# Patient Record
Sex: Male | Born: 1965
Health system: Southern US, Community
[De-identification: ages and names within clinical notes are randomized; demographics above are authoritative.]

## PROBLEM LIST (undated history)

## (undated) ENCOUNTER — Emergency Department: Disposition: A | Discharge status: Home / Self Care | Payer: 59

## (undated) DIAGNOSIS — I499 Cardiac arrhythmia, unspecified: Secondary | ICD-10-CM

---

## 2005-01-27 ENCOUNTER — Ambulatory Visit: Payer: Self-pay | Admitting: Cardiology

## 2014-05-12 ENCOUNTER — Encounter: Payer: Self-pay | Admitting: Orthopedic Surgery

## 2014-05-12 ENCOUNTER — Ambulatory Visit (INDEPENDENT_AMBULATORY_CARE_PROVIDER_SITE_OTHER): Payer: 59

## 2014-05-12 ENCOUNTER — Ambulatory Visit (INDEPENDENT_AMBULATORY_CARE_PROVIDER_SITE_OTHER): Payer: 59 | Admitting: Orthopedic Surgery

## 2014-05-12 VITALS — BP 126/76 | Ht 73.0 in | Wt 186.6 lb

## 2014-05-12 DIAGNOSIS — M719 Bursopathy, unspecified: Principal | ICD-10-CM

## 2014-05-12 DIAGNOSIS — M67919 Unspecified disorder of synovium and tendon, unspecified shoulder: Secondary | ICD-10-CM | POA: Insufficient documentation

## 2014-05-12 DIAGNOSIS — M25511 Pain in right shoulder: Secondary | ICD-10-CM

## 2014-05-12 DIAGNOSIS — M25519 Pain in unspecified shoulder: Secondary | ICD-10-CM

## 2014-05-12 NOTE — Progress Notes (Signed)
New patient visit  Chief Complaint  Patient presents with  . 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 His pain in the elbows lateral and associated with lifting and use  His shoulder pain is posterior and anterolateral along the deltoid associated with use. Associated with abduction but not necessarily for elevation mild night pain mild weakness with use no trauma  Medical history   Previous vasectomy   Current medications multivitamins and Aleve. Brief history Apolonio Schneiders fibrillation which resolved without medication he has no allergies his family history is noted for hypertension and cancer. 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   Review of systems has been recorded reviewed and signed and scanned into the chart  Vital signs BP 126/76  Ht 6\' 1"  (1.854 m)  Wt 186 lb 9.6 oz (84.641 kg)  BMI 24.62 kg/m2   General appearance: Development, nutrition are normal. Body habitus normal No gross deformities are noted and grooming normal.  Peripheral vascular system no swelling or varicose veins are noted and pulses are palpable without tenderness, temperature warm to touch no edema.  No palpable lymph nodes are noted in the cervical area or axillae.  The skin overlying the right and left shoulder cervical and thoracic spine is normal without rash, lesion or ulceration  Deep tendon reflexes are normal and equal. And pathologic reflexes such as Hoffman sign are negative.  Sensation remains normal.  The patient is oriented to person place and time, the mood and affect are normal  Ambulation remains normal  Cervical spine no mass or tenderness. Range of motion is normal. Muscle tone is normal. Skin is normal.  Right shoulder  No specific tenderness is palpated. A.c. joint is nontender the anterolateral acromion is  nontender the posterior joint line glenohumeral joint tenderness.  His range of motion is full. He has a negative abduction external rotation is normal stability of the shoulder. He has no weakness in any of the rotator cuff musculature.  His impingement sign is negative. Manual muscle testing the rotator cuff was normal grade 5. There is mild pain with Hawkins maneuver  Left shoulder  inspection reveals no tenderness or malalignment. There is no crepitation. The range of motion remains full flexion internal and external rotation. Stability tests are normal in abduction external rotation inferior subluxation test as well as the posterior stress test. Manual muscle testing of the supraspinatus, internal and external rotators 5 over 5  Cervical spine no tenderness  X-rays are normal of the shoulder  Rotator cuff syndrome right shoulder  Our recommendations are for Subacromial injection  Addendum right elbow lateral epicondyle and pain with wrist extension otherwise neurovascularly intact right upper extremity elbow ligaments are stable  Tennis elbow brace  No improvement in his shoulder after 4 weeks he is to call for an appointment

## 2014-05-12 NOTE — Patient Instructions (Signed)
Joint Injection  Care After  Refer to this sheet in the next few days. These instructions provide you with information on caring for yourself after you have had a joint injection. Your caregiver also may give you more specific instructions. Your treatment has been planned according to current medical practices, but problems sometimes occur. Call your caregiver if you have any problems or questions after your procedure.  After any type of joint injection, it is not uncommon to experience:  · Soreness, swelling, or bruising around the injection site.  · Mild numbness, tingling, or weakness around the injection site caused by the numbing medicine used before or with the injection.  It also is possible to experience the following effects associated with the specific agent after injection:  · Iodine-based contrast agents:  ¨ Allergic reaction (itching, hives, widespread redness, and swelling beyond the injection site).  · Corticosteroids (These effects are rare.):  ¨ Allergic reaction.  ¨ Increased blood sugar levels (If you have diabetes and you notice that your blood sugar levels have increased, notify your caregiver).  ¨ Increased blood pressure levels.  ¨ Mood swings.  · Hyaluronic acid in the use of viscosupplementation.  ¨ Temporary heat or redness.  ¨ Temporary rash and itching.  ¨ Increased fluid accumulation in the injected joint.  These effects all should resolve within a day after your procedure.   HOME CARE INSTRUCTIONS  · Limit yourself to light activity the day of your procedure. Avoid lifting heavy objects, bending, stooping, or twisting.  · Take prescription or over-the-counter pain medication as directed by your caregiver.  · You may apply ice to your injection site to reduce pain and swelling the day of your procedure. Ice may be applied 03-04 times:  ¨ Put ice in a plastic bag.  ¨ Place a towel between your skin and the bag.  ¨ Leave the ice on for no longer than 15-20 minutes each time.  SEEK  IMMEDIATE MEDICAL CARE IF:   · Pain and swelling get worse rather than better or extend beyond the injection site.  · Numbness does not go away.  · Blood or fluid continues to leak from the injection site.  · You have chest pain.  · You have swelling of your face or tongue.  · You have trouble breathing or you become dizzy.  · You develop a fever, chills, or severe tenderness at the injection site that last longer than 1 day.  MAKE SURE YOU:  · Understand these instructions.  · Watch your condition.  · Get help right away if you are not doing well or if you get worse.  Document Released: 06/02/2011 Document Revised: 12/12/2011 Document Reviewed: 06/02/2011  Exit[employer]® Patient Information ©2015 [employer]. This information is not intended to replace advice given to you by your health care provider. Make sure you discuss any questions you have with your health care provider.

## 2014-07-21 ENCOUNTER — Telehealth: Payer: Self-pay | Admitting: Orthopedic Surgery

## 2014-07-21 ENCOUNTER — Other Ambulatory Visit: Payer: Self-pay | Admitting: *Deleted

## 2014-07-21 DIAGNOSIS — M75101 Unspecified rotator cuff tear or rupture of right shoulder, not specified as traumatic: Secondary | ICD-10-CM

## 2014-07-21 NOTE — Telephone Encounter (Signed)
Pt ordered and patient aware to call and arrange

## 2014-07-21 NOTE — Telephone Encounter (Signed)
He will need to go for PT for 3 x a week x 6 weeks

## 2014-07-21 NOTE — Telephone Encounter (Signed)
Routing to Dr Harrison 

## 2014-07-23 ENCOUNTER — Ambulatory Visit: Payer: 59 | Attending: Orthopedic Surgery | Admitting: Physical Therapy

## 2014-07-23 DIAGNOSIS — M7550 Bursitis of unspecified shoulder: Secondary | ICD-10-CM | POA: Insufficient documentation

## 2014-07-23 DIAGNOSIS — M25511 Pain in right shoulder: Secondary | ICD-10-CM | POA: Diagnosis present

## 2014-07-23 DIAGNOSIS — M25521 Pain in right elbow: Secondary | ICD-10-CM | POA: Diagnosis not present

## 2014-07-23 DIAGNOSIS — M25611 Stiffness of right shoulder, not elsewhere classified: Secondary | ICD-10-CM | POA: Insufficient documentation

## 2014-07-30 ENCOUNTER — Ambulatory Visit: Payer: 59 | Admitting: Physical Therapy

## 2014-07-30 DIAGNOSIS — M25511 Pain in right shoulder: Secondary | ICD-10-CM | POA: Diagnosis not present

## 2014-08-04 ENCOUNTER — Ambulatory Visit: Payer: 59 | Attending: Orthopedic Surgery | Admitting: Physical Therapy

## 2014-08-04 DIAGNOSIS — M25521 Pain in right elbow: Secondary | ICD-10-CM | POA: Diagnosis not present

## 2014-08-04 DIAGNOSIS — M25611 Stiffness of right shoulder, not elsewhere classified: Secondary | ICD-10-CM | POA: Diagnosis not present

## 2014-08-04 DIAGNOSIS — M7550 Bursitis of unspecified shoulder: Secondary | ICD-10-CM | POA: Insufficient documentation

## 2014-08-04 DIAGNOSIS — M25511 Pain in right shoulder: Secondary | ICD-10-CM | POA: Diagnosis present

## 2014-08-06 ENCOUNTER — Ambulatory Visit: Payer: 59 | Admitting: Physical Therapy

## 2014-08-06 DIAGNOSIS — M25511 Pain in right shoulder: Secondary | ICD-10-CM | POA: Diagnosis not present

## 2014-08-11 ENCOUNTER — Ambulatory Visit: Payer: 59 | Admitting: Physical Therapy

## 2014-08-11 DIAGNOSIS — M25511 Pain in right shoulder: Secondary | ICD-10-CM | POA: Diagnosis not present

## 2014-08-13 ENCOUNTER — Ambulatory Visit: Payer: 59 | Admitting: Physical Therapy

## 2014-08-13 DIAGNOSIS — M25511 Pain in right shoulder: Secondary | ICD-10-CM | POA: Diagnosis not present

## 2014-08-18 ENCOUNTER — Ambulatory Visit: Payer: 59 | Admitting: Physical Therapy

## 2014-08-20 ENCOUNTER — Ambulatory Visit: Payer: 59 | Admitting: Physical Therapy

## 2014-08-21 ENCOUNTER — Ambulatory Visit: Payer: 59 | Admitting: Physical Therapy

## 2014-08-21 DIAGNOSIS — M25511 Pain in right shoulder: Secondary | ICD-10-CM | POA: Diagnosis not present

## 2014-08-25 ENCOUNTER — Ambulatory Visit: Payer: 59 | Admitting: Physical Therapy

## 2014-08-25 DIAGNOSIS — M25511 Pain in right shoulder: Secondary | ICD-10-CM | POA: Diagnosis not present

## 2014-08-27 ENCOUNTER — Encounter: Payer: 59 | Admitting: Physical Therapy

## 2014-09-01 ENCOUNTER — Encounter: Payer: 59 | Admitting: Physical Therapy

## 2017-02-24 DIAGNOSIS — H6121 Impacted cerumen, right ear: Secondary | ICD-10-CM | POA: Diagnosis not present

## 2017-02-24 DIAGNOSIS — Z13220 Encounter for screening for lipoid disorders: Secondary | ICD-10-CM | POA: Diagnosis not present

## 2017-02-24 DIAGNOSIS — Z125 Encounter for screening for malignant neoplasm of prostate: Secondary | ICD-10-CM | POA: Diagnosis not present

## 2017-02-24 DIAGNOSIS — Z0000 Encounter for general adult medical examination without abnormal findings: Secondary | ICD-10-CM | POA: Diagnosis not present

## 2017-02-24 DIAGNOSIS — Z131 Encounter for screening for diabetes mellitus: Secondary | ICD-10-CM | POA: Diagnosis not present

## 2017-07-02 ENCOUNTER — Emergency Department (INDEPENDENT_AMBULATORY_CARE_PROVIDER_SITE_OTHER)
Admission: EM | Admit: 2017-07-02 | Discharge: 2017-07-02 | Disposition: A | Discharge status: Home / Self Care | Payer: 59 | Source: Home / Self Care | Attending: Emergency Medicine | Admitting: Emergency Medicine

## 2017-07-02 ENCOUNTER — Telehealth: Payer: Self-pay | Admitting: Emergency Medicine

## 2017-07-02 ENCOUNTER — Encounter: Payer: Self-pay | Admitting: Emergency Medicine

## 2017-07-02 DIAGNOSIS — K0889 Other specified disorders of teeth and supporting structures: Secondary | ICD-10-CM | POA: Diagnosis not present

## 2017-07-02 MED ORDER — HYDROCODONE-ACETAMINOPHEN 5-325 MG PO TABS: 1.0000 | ORAL_TABLET | ORAL | 0 refills | Status: DC | PRN

## 2017-07-02 NOTE — ED Triage Notes (Signed)
Tooth & Jaw Pain x 4 days

## 2017-07-02 NOTE — Telephone Encounter (Signed)
Notified patient that his elevated BP at today's visit should be followed up with his PCP.

## 2017-07-02 NOTE — Discharge Instructions (Signed)
Please call your dentist first thing in the morning to be checked. Take ibuprofen 600 mg every 6 hours with food. You can apply some anesthetic dental paste to the tooth and see if that gives you some relief.

## 2017-07-02 NOTE — ED Provider Notes (Addendum)
[facility]    CSN: [id number] Arrival date & time: 07/02/17  1544     History   Chief Complaint Chief Complaint  Patient presents with  . Dental Pain    HPI ISAURO SKELLEY is a 51 y.o. male.   HPI Pertinent history on this patient is that he developed pain in his right lower posterior molar last week. He went to the dentist on Thursday and was seen by Dr. Bing Plume who works with Dr. Ronnald Ramp. X-rays were taken and these were normal. He was sent to the specialist for evaluation because the pain was felt to becoming from a nerve. The specialist did not find any signs of a nerve root problem and so did not perform any treatment. Over the past 3 days he has had excruciating pain which has been in the right lower posterior molar extending to the lower portion of the right jaw. He was initially taking 2 Aleve every 3 or 4 hours. He was taking this with Tylenol. He had a prescription for amoxicillin but was advised by the dental surgeon not to take it because there were no signs of infection. He understood today because he cannot get relief. He did take 2 Tylenol 3 without any improvement. History reviewed. No pertinent past medical history.  Patient Active Problem List   Diagnosis Date Noted  . Disorders of bursae and tendons in shoulder region, unspecified 05/12/2014    History reviewed. No pertinent surgical history.     Home Medications    Prior to Admission medications   Medication Sig Start Date End Date Taking? Authorizing Provider  HYDROcodone-acetaminophen (NORCO) 5-325 MG tablet Take 1 tablet by mouth every 4 (four) hours as needed for moderate pain or severe pain. 07/02/17   Darlyne Russian, MD    Family History History reviewed. No pertinent family history.  Social History Social History  Substance Use Topics  . Smoking status: Never Smoker  . Smokeless tobacco: Never Used  . Alcohol use No     Allergies   Patient has no known allergies.   Review of  Systems Review of Systems  Constitutional: Negative.   HENT: Positive for dental problem. Negative for ear discharge, ear pain, facial swelling and sinus pain.   Respiratory: Negative.      Physical Exam Triage Vital Signs ED Triage Vitals  Enc Vitals Group     BP 07/02/17 1602 (!) 153/98     Pulse Rate 07/02/17 1602 65     Resp 07/02/17 1602 16     Temp 07/02/17 1602 98.5 F (36.9 C)     Temp Source 07/02/17 1602 Oral     SpO2 07/02/17 1602 97 %     Weight 07/02/17 1602 195 lb (88.5 kg)     Height 07/02/17 1602 6\' 1"  (1.854 m)     Head Circumference --      Peak Flow --      Pain Score 07/02/17 1603 8     Pain Loc --      Pain Edu? --      Excl. in [place]? --    No data found.   Updated Vital Signs BP (!) 153/98 (BP Location: Left Arm)   Pulse 65   Temp 98.5 F (36.9 C) (Oral)   Resp 16   Ht 6\' 1"  (1.854 m)   Wt 195 lb (88.5 kg)   SpO2 97%   BMI 25.73 kg/m   Visual Acuity Right Eye Distance:   Left  Eye Distance:   Bilateral Distance:    Right Eye Near:   Left Eye Near:    Bilateral Near:     Physical Exam  Constitutional: He appears well-developed and well-nourished.  HENT:  Head: Normocephalic.  Nose: Nose normal.  Mouth/Throat: Oropharynx is clear and moist.  Neck: Normal range of motion. Neck supple.  There is exquisite tenderness to palpation over the right lower second molar. There is no swelling around the gumline. There is no lymph node beneath the tooth.He does appear uncomfortable complaining of pain in his tooth.   UC Treatments / Results  Labs (all labs ordered are listed, but only abnormal results are displayed) Labs Reviewed - No data to display  EKG  EKG Interpretation None       Radiology No results found.  Procedures Procedures (including critical care time)  Medications Ordered in UC Medications - No data to display   Initial Impression / Assessment and Plan / UC Course  I have reviewed the triage vital signs and the  nursing notes. I suspect the patient's blood pressure is elevated because he is having so much discomfort in his tooth. The tooth itself was tender to touch with a tongue blade. There are no signs of infection. He is going to see the dentist again tomorrow. I have given him 10 hydrocodone to take for pain. He will be on ibuprofen 600 mg every 6 hours over-the-counter.he was seen by his oral surgeon on Friday and advised he did not need to be on antibiotics. There were no signs of infection at that time and we'll continue him off of antibiotics until seen again tomorrow.blood pressure was elevated at 153/98 I suspect secondary to the pain he is in. I did advise him to follow-up after he has had his dental pain addressed. Pertinent labs & imaging results that were available during my care of the patient were reviewed by me and considered in my medical decision making (see chart for details).       Final Clinical Impressions(s) / UC Diagnoses   Final diagnoses:  Pain, dental    New Prescriptions New Prescriptions   HYDROCODONE-ACETAMINOPHEN (NORCO) 5-325 MG TABLET    Take 1 tablet by mouth every 4 (four) hours as needed for moderate pain or severe pain.      Controlled Substance Prescriptions [place] Controlled Substance Registry consulted? Yes, I have consulted the [place] Controlled Substances Registry for this patient, and feel the risk/benefit ratio today is favorable for proceeding with this prescription for a controlled substance.   Darlyne Russian, MD 07/02/17 1704    Darlyne Russian, MD 07/02/17 [phone]

## 2017-09-12 ENCOUNTER — Emergency Department (HOSPITAL_COMMUNITY): Payer: 59

## 2017-09-12 ENCOUNTER — Emergency Department (HOSPITAL_COMMUNITY)
Admission: EM | Admit: 2017-09-12 | Discharge: 2017-09-12 | Disposition: A | Discharge status: Home / Self Care | Payer: 59 | Attending: Emergency Medicine | Admitting: Emergency Medicine

## 2017-09-12 ENCOUNTER — Encounter (HOSPITAL_COMMUNITY): Payer: Self-pay

## 2017-09-12 DIAGNOSIS — W1843XA Slipping, tripping and stumbling without falling due to stepping from one level to another, initial encounter: Secondary | ICD-10-CM | POA: Diagnosis not present

## 2017-09-12 DIAGNOSIS — M79652 Pain in left thigh: Secondary | ICD-10-CM

## 2017-09-12 DIAGNOSIS — Y9389 Activity, other specified: Secondary | ICD-10-CM | POA: Diagnosis not present

## 2017-09-12 DIAGNOSIS — Y999 Unspecified external cause status: Secondary | ICD-10-CM | POA: Diagnosis not present

## 2017-09-12 DIAGNOSIS — S79912A Unspecified injury of left hip, initial encounter: Secondary | ICD-10-CM | POA: Diagnosis not present

## 2017-09-12 DIAGNOSIS — Y929 Unspecified place or not applicable: Secondary | ICD-10-CM | POA: Insufficient documentation

## 2017-09-12 DIAGNOSIS — W19XXXA Unspecified fall, initial encounter: Secondary | ICD-10-CM

## 2017-09-12 DIAGNOSIS — M25552 Pain in left hip: Secondary | ICD-10-CM | POA: Diagnosis not present

## 2017-09-12 DIAGNOSIS — S79922A Unspecified injury of left thigh, initial encounter: Secondary | ICD-10-CM | POA: Diagnosis not present

## 2017-09-12 MED ORDER — ONDANSETRON 4 MG PO TBDP
4.0000 mg | ORAL_TABLET | Freq: Once | ORAL | Status: AC
  Administered 2017-09-12: 4 mg via ORAL

## 2017-09-12 MED ORDER — TRAMADOL HCL 50 MG PO TABS: 50.0000 mg | ORAL_TABLET | Freq: Two times a day (BID) | ORAL | 0 refills | Status: DC | PRN

## 2017-09-12 MED ORDER — OXYCODONE-ACETAMINOPHEN 5-325 MG PO TABS
1.0000 | ORAL_TABLET | Freq: Once | ORAL | Status: AC
  Administered 2017-09-12: 1 via ORAL
  Filled 2017-09-12: qty 1

## 2017-09-12 NOTE — ED Triage Notes (Signed)
Patient stepped out of truck and slipped with left femur pain and heard pop, no loc. Pain with ambulation. VSS

## 2017-09-12 NOTE — ED Notes (Signed)
Pt returned from imaging.

## 2017-09-12 NOTE — Discharge Instructions (Signed)
1. Medications: Alternate 600 mg of ibuprofen and [phone] mg of Tylenol every 3 hours as needed for pain. Do not exceed 4000 mg of Tylenol daily.  Take tramadol for severe pain but do not drive, drink alcohol, or operate heavy machinery on this medication as it may make you drowsy. 2. Treatment: rest, ice, elevate and use Ace wrap, drink plenty of fluids, gentle stretching 3. Follow Up: Please followup with orthopedics as directed or your PCP in 1 week if no improvement for discussion of your diagnoses and further evaluation after today's visit; Please return to the ER for worsening symptoms or other concerns such as fevers, severe swelling, or weakness.

## 2017-09-12 NOTE — Progress Notes (Signed)
Orthopedic Tech Progress Note Patient Details:  Jerry Pacheco [birth year] [id number]  Ortho Devices Type of Ortho Device: Crutches Ortho Device/Splint Interventions: Application   Post Interventions Patient Tolerated: Well, Ambulated well Instructions Provided: Care of device, Adjustment of device   [name] 09/12/2017, 12:21 PM

## 2017-09-12 NOTE — ED Notes (Signed)
Patient transported to X-ray 

## 2017-09-12 NOTE — ED Provider Notes (Signed)
[facility]PART[facility] Provider Note   CSN: [id number] Arrival date & time: 09/12/17  1026     History   Chief Complaint No chief complaint on file.   HPI Jerry Pacheco is a 51 y.o. male with no significant past medical history presents today with chief complaint acute onset, constant left thigh pain.  He states that just prior to arrival he was stepping outside of a vehicle when he stepped on some ice with his right leg giving out.  He states in the process he shifted all of his weight to his left lower extremity and heard a "pop".  He endorses severe throbbing pain to the left thigh which does not radiate.  Pain worsens with movement, palpation, and ambulation and he states he is unable to bear weight on the lower extremity.  Denies numbness, tingling, or weakness.  He denies head injury or loss of consciousness.  No back pain, bowel or bladder incontinence, saddle anesthesia, IV drug use, or fevers.  Has not tried anything for his symptoms.  The history is provided by the patient.    History reviewed. No pertinent past medical history.  Patient Active Problem List   Diagnosis Date Noted  . Disorders of bursae and tendons in shoulder region, unspecified 05/12/2014    History reviewed. No pertinent surgical history.     Home Medications    Prior to Admission medications   Medication Sig Start Date End Date Taking? Authorizing Provider  HYDROcodone-acetaminophen (NORCO) 5-325 MG tablet Take 1 tablet by mouth every 4 (four) hours as needed for moderate pain or severe pain. Patient not taking: Reported on 09/12/2017 07/02/17   Jerry Russian, MD  traMADol (ULTRAM) 50 MG tablet Take 1 tablet (50 mg total) by mouth every 12 (twelve) hours as needed for severe pain. 09/12/17   Renita Papa, PA-C    Family History No family history on file.  Social History Social History   Tobacco Use  . Smoking status: Never Smoker  . Smokeless tobacco: Never  Used  Substance Use Topics  . Alcohol use: No  . Drug use: No     Allergies   Patient has no known allergies.   Review of Systems Review of Systems  Constitutional: Negative for chills and fever.  Respiratory: Negative for shortness of breath.   Cardiovascular: Negative for chest pain.  Gastrointestinal: Negative for abdominal pain, nausea and vomiting.  Musculoskeletal: Positive for arthralgias and gait problem.  Neurological: Negative for syncope, weakness, numbness and headaches.     Physical Exam Updated Vital Signs BP 127/75   Pulse 91   Temp 98.1 F (36.7 C) (Oral)   Resp 18   SpO2 100%   Physical Exam  Constitutional: He is oriented to person, place, and time. He appears well-developed and well-nourished. No distress.  Appears uncomfortable  HENT:  Head: Normocephalic and atraumatic.  Right Ear: External ear normal.  Left Ear: External ear normal.  No Battle's signs, no raccoon's eyes, no rhinorrhea.No tenderness to palpation of the face or skull. No deformity, crepitus, or swelling noted.   Eyes: Conjunctivae are normal. Right eye exhibits no discharge. Left eye exhibits no discharge.  Neck: Normal range of motion. Neck supple. No JVD present. No tracheal deviation present.  Cardiovascular: Normal rate, regular rhythm, normal heart sounds and intact distal pulses.  2+ radial and DP/PT pulses bl, negative Homan's bl   Pulmonary/Chest: Effort normal and breath sounds normal.  Abdominal: Soft. Bowel sounds are  normal. He exhibits no distension. There is no tenderness.  Musculoskeletal: He exhibits tenderness. He exhibits no edema.  Left thigh tender to palpation anteriorly with mild ecchymosis noted.  Severely decreased range of motion of the knee and hip secondary to pain, but quadriceps tendon appears intact.  No tenderness to palpation of the knee or hip.  No deformity or crepitus noted.  No leg length discrepancy.  No malalignment of the left lower externally.   No ecchymosis or swelling noted. 5/5 strength of BUE and BLE major muscle groups. No midline spine TTP, no paraspinal muscle tenderness, no deformity, crepitus, or step-off noted   Neurological: He is alert and oriented to person, place, and time. No sensory deficit. He exhibits normal muscle tone.  Fluent speech, no facial droop, sensation intact to soft touch of the face and extremities.  Unable to assess gait secondary to pain in the left thigh, but patient has good grip strength and good strength with plantarflexion, dorsiflexion, and extension of the EHLs.  Skin: Skin is warm and dry. No erythema.  Psychiatric: He has a normal mood and affect. His behavior is normal.  Nursing note and vitals reviewed.    ED Treatments / Results  Labs (all labs ordered are listed, but only abnormal results are displayed) Labs Reviewed - No data to display  EKG  EKG Interpretation None       Radiology Dg Hip Unilat With Pelvis 2-3 Views Left  Result Date: 09/12/2017 CLINICAL DATA:  Pain after slipping without frank fall EXAM: DG HIP (WITH OR WITHOUT PELVIS) 2-3V LEFT COMPARISON:  None. FINDINGS: Frontal pelvis as well as frontal and lateral left hip images were obtained. No appreciable fracture or dislocation. Joint spaces appear normal. No erosive change. IMPRESSION: No fracture or dislocation.  No evident arthropathy. Electronically Signed   By: Lowella Grip III M.D.   On: 09/12/2017 11:42   Dg Femur Min 2 Views Left  Result Date: 09/12/2017 CLINICAL DATA:  Patient with acute pain after slipping. No frank fall EXAM: LEFT FEMUR 2 VIEWS COMPARISON:  None. FINDINGS: Frontal and lateral views were obtained. No evident fracture or dislocation. No abnormal periosteal reaction. No appreciable arthropathic change. IMPRESSION: No fracture or dislocation.  No evident arthropathy. Electronically Signed   By: Lowella Grip III M.D.   On: 09/12/2017 11:41    Procedures Procedures (including  critical care time)  Medications Ordered in ED Medications  oxyCODONE-acetaminophen (PERCOCET/ROXICET) 5-325 MG per tablet 1 tablet (1 tablet Oral Given 09/12/17 1114)  ondansetron (ZOFRAN-ODT) disintegrating tablet 4 mg (4 mg Oral Given 09/12/17 1115)     Initial Impression / Assessment and Plan / ED Course  I have reviewed the triage vital signs and the nursing notes.  Pertinent labs & imaging results that were available during my care of the patient were reviewed by me and considered in my medical decision making (see chart for details).     Patient with left thigh pain secondary to injury earlier today.  Afebrile, vital signs are stable.  He is neurovascularly intact.  No complaint of back pain and no red flag signs concerning for cauda equina.  He is able to pull himself up to a standing position without difficulty  But has some pain with weightbearing in the left side.  On prompting the patient, PT is able to walk on his heels and toes without difficulty and take a few steps although it is painful.  Radiographs reviewed by me show no evidence of fracture  or dislocation.  Likely myofascial pain or soft tissue injury. Doubt DVT. Stable for discharge home with crutches and Ace wrap.  He will follow-up with orthopedics for reevaluation of his symptoms.  Pain managed while in the ED. [place] Controlled Substance Registry shows he received small amount of tylenol #3 in September but no controlled substances since. Will discharge with small amount of tramadol for severe pain. Discussed indications for return to the ED. Pt verbalized understanding of and agreement with plan and is safe for discharge home at this time. No complaints prior to discharge.    Final Clinical Impressions(s) / ED Diagnoses   Final diagnoses:  Pain of left thigh  Fall, initial encounter    ED Discharge Orders        Ordered    traMADol (ULTRAM) 50 MG tablet  Every 12 hours PRN     09/12/17 1159         Renita Papa, PA-C 09/12/17 1214    Julianne Rice, MD 09/13/17 1004

## 2017-09-18 ENCOUNTER — Ambulatory Visit (INDEPENDENT_AMBULATORY_CARE_PROVIDER_SITE_OTHER): Payer: 59 | Admitting: Orthopedic Surgery

## 2017-09-18 ENCOUNTER — Encounter: Payer: Self-pay | Admitting: Orthopedic Surgery

## 2017-09-18 VITALS — BP 128/78 | HR 70 | Ht 73.0 in | Wt 215.0 lb

## 2017-09-18 DIAGNOSIS — S76112A Strain of left quadriceps muscle, fascia and tendon, initial encounter: Secondary | ICD-10-CM | POA: Diagnosis not present

## 2017-09-18 NOTE — Progress Notes (Signed)
Progress Note   Patient ID: Jerry Pacheco, male   DOB: 1966-03-02, 51 y.o.   MRN: [id number]  Chief Complaint  Patient presents with  . Leg Pain    left thigh pain 09/12/17    HPI 51 year old male slipped and fell had x-rays and ER pelvis and femur no fracture  Comes in with thigh pain  Location pain left thigh quality dull severity mild duration 6 days timing  Context: Slight improvement  Modifying factors stairclimbing  Associated symptoms swelling  Review of Systems  Constitutional: Negative for chills, fever and weight loss.  Respiratory: Negative for shortness of breath.   Cardiovascular: Negative for chest pain.  Neurological: Negative for tingling.   No outpatient medications have been marked as taking for the 09/18/17 encounter (Office Visit) with Carole Civil, MD.    History reviewed. No pertinent past medical history. Denies hypertension diabetes  No Known Allergies  BP 128/78   Pulse 70   Ht 6\' 1"  (1.854 m)   Wt 215 lb (97.5 kg)   BMI 28.37 kg/m    Physical Exam  Constitutional: He is oriented to person, place, and time. He appears well-developed and well-nourished.  Vital signs have been reviewed and are stable. Gen. appearance the patient is well-developed and well-nourished with normal grooming and hygiene.   Musculoskeletal:  GAIT IS favorable limp of the left side  Neurological: He is alert and oriented to person, place, and time.  Skin: Skin is warm and dry. No erythema.  Psychiatric: He has a normal mood and affect.  Vitals reviewed.   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  . Rupture of left quadriceps muscle, initial encounter Yes    Recommend economy hinged brace, reduced activity at work  Follow-up 6 weeks    Arther Abbott, MD 09/18/2017 8:54 AM

## 2017-10-30 ENCOUNTER — Ambulatory Visit (INDEPENDENT_AMBULATORY_CARE_PROVIDER_SITE_OTHER): Payer: 59 | Admitting: Orthopedic Surgery

## 2017-10-30 ENCOUNTER — Encounter: Payer: Self-pay | Admitting: Orthopedic Surgery

## 2017-10-30 VITALS — BP 131/81 | HR 65 | Ht 73.0 in | Wt 216.0 lb

## 2017-10-30 DIAGNOSIS — S76112D Strain of left quadriceps muscle, fascia and tendon, subsequent encounter: Secondary | ICD-10-CM | POA: Diagnosis not present

## 2017-10-30 NOTE — Patient Instructions (Signed)
Exercises daily for 6 weeks

## 2017-10-30 NOTE — Progress Notes (Signed)
Progress Note   Patient ID: Jerry Pacheco, male   DOB: January 25, 1966, 52 y.o.   MRN: [id number]  Chief Complaint  Patient presents with  . Knee Pain    Left quad, feels better date of injury 09/12/17    52 year old male had a partial quadriceps injury left leg approximately 6 weeks ago he says he feels much better we placed him in a brace he did well is able to go up steps he just cannot single leg squat.     ROS No outpatient medications have been marked as taking for the 10/30/17 encounter (Office Visit) with Carole Civil, MD.    No Known Allergies   BP 131/81   Pulse 65   Ht 6\' 1"  (1.854 m)   Wt 216 lb (98 kg)   BMI 28.50 kg/m   Physical Exam  Musculoskeletal:       Legs:    Medical decision-making Encounter Diagnosis  Name Primary?  . Rupture of left quadriceps muscle, subsequent encounter Yes      Home exercises for quadriceps strengthening for 6 weeks  Released   Arther Abbott, MD 10/30/2017 9:15 AM

## 2018-02-28 DIAGNOSIS — Z125 Encounter for screening for malignant neoplasm of prostate: Secondary | ICD-10-CM | POA: Diagnosis not present

## 2018-02-28 DIAGNOSIS — Z1211 Encounter for screening for malignant neoplasm of colon: Secondary | ICD-10-CM | POA: Diagnosis not present

## 2018-02-28 DIAGNOSIS — Z13220 Encounter for screening for lipoid disorders: Secondary | ICD-10-CM | POA: Diagnosis not present

## 2018-02-28 DIAGNOSIS — Z131 Encounter for screening for diabetes mellitus: Secondary | ICD-10-CM | POA: Diagnosis not present

## 2018-02-28 DIAGNOSIS — Z0000 Encounter for general adult medical examination without abnormal findings: Secondary | ICD-10-CM | POA: Diagnosis not present

## 2018-03-21 DIAGNOSIS — H5213 Myopia, bilateral: Secondary | ICD-10-CM | POA: Diagnosis not present

## 2018-03-21 DIAGNOSIS — H52223 Regular astigmatism, bilateral: Secondary | ICD-10-CM | POA: Diagnosis not present

## 2018-07-02 ENCOUNTER — Ambulatory Visit (INDEPENDENT_AMBULATORY_CARE_PROVIDER_SITE_OTHER): Payer: Self-pay

## 2018-07-02 DIAGNOSIS — Z1211 Encounter for screening for malignant neoplasm of colon: Secondary | ICD-10-CM

## 2018-07-02 MED ORDER — CLENPIQ 10-3.5-12 MG-GM -GM/160ML PO SOLN: 1.0000 | Freq: Once | ORAL | 0 refills | Status: AC

## 2018-07-02 NOTE — Progress Notes (Signed)
Gastroenterology Pre-Procedure Review  Request Date:07/02/18 Requesting Physician: Dr.Morrow- Jerry Pacheco at [facility]- no previous tcs  PATIENT REVIEW QUESTIONS: The patient responded to the following health history questions as indicated:    1. Diabetes Melitis: no 2. Joint replacements in the past 12 months: no 3. Major health problems in the past 3 months: no 4. Has an artificial valve or MVP: no 5. Has a defibrillator: no 6. Has been advised in past to take antibiotics in advance of a procedure like teeth cleaning: no 7. Family history of colon cancer: no  8. Alcohol Use: yes (occasionally) 9. History of sleep apnea: no  10. History of coronary artery or other vascular stents placed within the last 12 months: no 11. History of any prior anesthesia complications: no    MEDICATIONS & ALLERGIES:    Patient reports the following regarding taking any blood thinners:   Plavix? no Aspirin? no Coumadin? no Brilinta? no Xarelto? no Eliquis? no Pradaxa? no Savaysa? no Effient? no  Patient confirms/reports the following medications:  No current outpatient medications on file.   No current facility-administered medications for this visit.     Patient confirms/reports the following allergies:  No Known Allergies  No orders of the defined types were placed in this encounter.   AUTHORIZATION INFORMATION Primary Insurance: [employer] [place],  [place] #: [id number] Pre-Cert / Josem Kaufmann required:  Pre-Cert / Josem Kaufmann #:    SCHEDULE INFORMATION: Procedure has been scheduled as follows:  Date:08/22/18 , Time: 1:00 Location: [facility] Dr.Rourk  This Gastroenterology Pre-Precedure Review Form is being routed to the following provider(s): Walden Field NP

## 2018-07-02 NOTE — Patient Instructions (Addendum)
CLENPIQ SPLIT PREP INSTRUCTIONS   Patient Name:  Jerry Pacheco Date of procedure:  10/10/18 Time to register at [place] Stay:  10:15pm Provider:  Dr. Gala Romney  Due to a change in the hospital schedule, we had to change the time of your colonoscopy. Please note the following time changes: You will need to arrive at 10:15 instead of 11:00am and on the day of your colonoscopy, you will need to drink your prep at 6:15am instead of 7:00am and please stop drinking liquids at 8:15am. Sorry for any confusion. Please call if you have any questions.    Please notify us immediately if you are diabetic, take iron supplements, or if you are on coumadin or any blood thinners.    Note: Do NOT refrigerate or freeze CLENPIQ. CLENPIQ is ready to drink. There is no need to add any other liquid or mix the medicine in the bottle before you start dosing.   10/09/18  1 Day prior to procedure:     CLEAR LIQUIDS ALL DAY--NO SOLID FOODS OR DAIRY PRODUCTS! See list of liquids that are allowed and items that are NOT allowed below.     You must drink plenty of CLEAR LIQUIDS starting before your bowel prep. It is important to stay adequately hydrated before, during, and after your bowel prep for the prep to work effectively!    At 5:00 PM Begin the prep as follows:    1. Drink one bottle of premixed CLENPIQ right from the bottle. 2. Drink at least five (5) 8-ounce drinks of clear liquids of your choice within the next 5 hours   Continue clear liquids.    10/10/18 Day of Procedure    You may take TYLENOL products. Please continue your regular medications unless we have instructed you otherwise.    5 hours before procedure @ 6:15am:  1. Drink second bottle of premixed CLENPIQ right from the bottle.   2. Drink at least three (3) 8-ounce drinks of clear liquids of your choice within the next 2 hours. You can drink more if needed.   3 hours before your procedure time @ 8:15am: Stop drinking all liquids,  nothing by mouth at this point.  Please note, on the day of your procedure you MUST be accompanied by an adult who is willing to assume responsibility for you at time of discharge. If you do not have such person with you, your procedure will have to be rescheduled.                                                                                                                     Please leave ALL jewelry at home prior to coming to the hospital for your procedure.   *It is your responsibility to check with your insurance company for the benefits of coverage you have for this procedure. Unfortunately, not all insurance companies have benefits to cover all or part of these types of procedures. It is your responsibility to check your benefits, however  we will be glad to assist you with any codes your insurance company may need.   Please note that most insurance companies will not cover a screening colonoscopy for people under the age of 51  For example, with some insurance companies you may have benefits for a screening colonoscopy, but if polyps are found the diagnosis will change and then you may have a deductible that will need to be met. Please make sure you check your benefits for screening colonoscopy as well as a diagnostic colonoscopy.   CLEAR LIQUIDS: (NO RED or PURPLE) Water  Jello   Apple Juice  White Grape Juice   Kool-Aid Soft drinks  Banana popsicles Sports Drink  Black coffee (No cream or milk) Tea (No cream or milk)  Broth (fat free beef/chicken/vegetable)  Clear liquids allow you to see your fingers on the other side of the glass.  Be sure they are NOT RED or PURPLE in color, cloudy, but CLEAR.  Do Not Eat: Dairy products of any kind Cranberry juice Tomato or V8 Juice  Orange Juice   Grapefruit Juice Red Grape Juice Alcohol   Non-dairy creamer Solid foods like cereal, oatmeal, yogurt, fruits, vegetables, creamed soups, eggs, bread, etc   HELPFUL HINTS TO MAKE DRINKING  EASIER: -Trying drinking through a straw. -If you become nauseated, try consuming smaller amounts or stretch out the time between glasses.  Stop for 30 minutes & slowly start back drinking.  Call our office with any questions or concerns at [phone] Thank You

## 2018-07-03 NOTE — Progress Notes (Signed)
Ok to schedule.

## 2018-08-06 ENCOUNTER — Telehealth: Payer: Self-pay | Admitting: Internal Medicine

## 2018-08-06 NOTE — Progress Notes (Signed)
Pts wife called and rescheduled pts tcs to 10/10/18. See phone note.

## 2018-08-06 NOTE — Telephone Encounter (Signed)
Pt's wife called to reschedule patient's colonoscopy with [facility] on 11/20. [phone]

## 2018-08-06 NOTE — Telephone Encounter (Signed)
Called and spoke with the pts wife. Changed appt for tcs to 10/10/18. New instructions mailed to the pt. Carolyn in endo is aware.

## 2018-08-07 NOTE — Progress Notes (Signed)
Jerry Pacheco called and we had to move up the pts appointment 45 minutes. I have changed his instructions and mailed them to the pt.

## 2018-09-27 MED FILL — CLENPIQ 10-3.5-12 MG-GM -GM: 10-3.5-12 M | 2 days supply | Qty: 320 | Fill #0

## 2018-10-10 ENCOUNTER — Encounter (HOSPITAL_COMMUNITY): Payer: Self-pay | Admitting: Anesthesiology

## 2018-10-10 ENCOUNTER — Encounter (HOSPITAL_COMMUNITY): Payer: Self-pay | Admitting: *Deleted

## 2018-10-10 ENCOUNTER — Other Ambulatory Visit: Payer: Self-pay

## 2018-10-10 ENCOUNTER — Ambulatory Visit (HOSPITAL_COMMUNITY)
Admission: RE | Admit: 2018-10-10 | Discharge: 2018-10-10 | Disposition: A | Discharge status: Home / Self Care | Payer: 59 | Attending: Internal Medicine | Admitting: Internal Medicine

## 2018-10-10 ENCOUNTER — Encounter (HOSPITAL_COMMUNITY)
Admission: RE | Disposition: A | Discharge status: Home / Self Care | Payer: Self-pay | Source: Home / Self Care | Attending: Internal Medicine

## 2018-10-10 DIAGNOSIS — K640 First degree hemorrhoids: Secondary | ICD-10-CM | POA: Insufficient documentation

## 2018-10-10 DIAGNOSIS — Z1211 Encounter for screening for malignant neoplasm of colon: Secondary | ICD-10-CM | POA: Diagnosis not present

## 2018-10-10 DIAGNOSIS — D125 Benign neoplasm of sigmoid colon: Secondary | ICD-10-CM | POA: Diagnosis not present

## 2018-10-10 DIAGNOSIS — D123 Benign neoplasm of transverse colon: Secondary | ICD-10-CM | POA: Diagnosis not present

## 2018-10-10 HISTORY — PX: POLYPECTOMY: SHX5525

## 2018-10-10 HISTORY — PX: COLONOSCOPY: SHX5424

## 2018-10-10 HISTORY — DX: Cardiac arrhythmia, unspecified: I49.9

## 2018-10-10 SURGERY — COLONOSCOPY
Anesthesia: Moderate Sedation

## 2018-10-10 MED ORDER — ONDANSETRON HCL 4 MG/2ML IJ SOLN
INTRAMUSCULAR | Status: DC | PRN
  Administered 2018-10-10: 4 mg via INTRAVENOUS

## 2018-10-10 MED ORDER — MEPERIDINE HCL 100 MG/ML IJ SOLN
INTRAMUSCULAR | Status: DC | PRN
  Administered 2018-10-10: 25 mg via INTRAVENOUS

## 2018-10-10 MED ORDER — MIDAZOLAM HCL 5 MG/5ML IJ SOLN
INTRAMUSCULAR | Status: DC | PRN
  Administered 2018-10-10: 1 mg via INTRAVENOUS
  Administered 2018-10-10: 2 mg via INTRAVENOUS
  Administered 2018-10-10 (×4): 1 mg via INTRAVENOUS
  Administered 2018-10-10: 2 mg via INTRAVENOUS
  Administered 2018-10-10: 1 mg via INTRAVENOUS

## 2018-10-10 MED ORDER — ONDANSETRON HCL 4 MG/2ML IJ SOLN
INTRAMUSCULAR | Status: AC
  Filled 2018-10-10: qty 2

## 2018-10-10 MED ORDER — SODIUM CHLORIDE 0.9 % IV SOLN
INTRAVENOUS | Status: DC
  Administered 2018-10-10: 1000 mL via INTRAVENOUS

## 2018-10-10 MED ORDER — MEPERIDINE HCL 50 MG/ML IJ SOLN
INTRAMUSCULAR | Status: AC
  Filled 2018-10-10: qty 1

## 2018-10-10 MED ORDER — MIDAZOLAM HCL 5 MG/5ML IJ SOLN
INTRAMUSCULAR | Status: AC
  Filled 2018-10-10: qty 10

## 2018-10-10 NOTE — Op Note (Signed)
[facility] Patient Name: Jerry Pacheco Procedure Date: 10/10/2018 11:20 AM MRN: [id number] Date of Birth: [birth year] Attending MD: Norvel Richards , MD CSN: [id number] Age: 53 Admit Type: Outpatient Procedure:                Colonoscopy Indications:              Screening for colorectal malignant neoplasm Providers:                Norvel Richards, MD, Lurline Del, RN, Nelma Rothman, Technician Referring MD:              Medicines:                Midazolam 10 mg IV, Meperidine 25 mg IV Complications:            No immediate complications. Estimated Blood Loss:     Estimated blood loss: none. Procedure:                Pre-Anesthesia Assessment:                           - Prior to the procedure, a History and Physical                            was performed, and patient medications and                            allergies were reviewed. The patient's tolerance of                            previous anesthesia was also reviewed. The risks                            and benefits of the procedure and the sedation                            options and risks were discussed with the patient.                            All questions were answered, and informed consent                            was obtained. Prior Anticoagulants: The patient has                            taken no previous anticoagulant or antiplatelet                            agents. ASA Grade Assessment: II - A patient with                            mild systemic disease. After reviewing the risks  and benefits, the patient was deemed in                            satisfactory condition to undergo the procedure.                           After obtaining informed consent, the colonoscope                            was passed under direct vision. Throughout the                            procedure, the patient's blood pressure, pulse, and      oxygen saturations were monitored continuously. The                            CF-[id number] ([id number]) scope was introduced through                            the anus and advanced to the the ileocecal valve.                            The colonoscopy was performed without difficulty.                            The patient tolerated the procedure well. The                            quality of the bowel preparation was adequate. Scope In: 11:28:19 AM Scope Out: 11:54:14 AM Scope Withdrawal Time: 0 hours 9 minutes 58 seconds  Total Procedure Duration: 0 hours 25 minutes 55 seconds  Findings:      The perianal and digital rectal examinations were normal.      Two sessile polyps were found in the sigmoid colon and hepatic flexure.       The polyps were 4 to 6 mm in size. These polyps were removed with a cold       snare. Resection and retrieval were complete. Estimated blood loss was       minimal.      Non-bleeding internal hemorrhoids were found during retroflexion. The       hemorrhoids were moderate, medium-sized and Grade I (internal       hemorrhoids that do not prolapse).      The exam was otherwise without abnormality on direct and retroflexion       views. Impression:               - Two 4 to 6 mm polyps in the sigmoid colon and at                            the hepatic flexure, removed with a cold snare.                            Resected and retrieved.                           -  Non-bleeding internal hemorrhoids.                           - The examination was otherwise normal on direct                            and retroflexion views. Moderate Sedation:      Moderate (conscious) sedation was administered by the endoscopy nurse       and supervised by the endoscopist. The following parameters were       monitored: oxygen saturation, heart rate, blood pressure, respiratory       rate, EKG, adequacy of pulmonary ventilation, and response to care.       Total physician  intraservice time was 31 minutes. Recommendation:           - Patient has a contact number available for                            emergencies. The signs and symptoms of potential                            delayed complications were discussed with the                            patient. Return to normal activities tomorrow.                            Written discharge instructions were provided to the                            patient.                           - Advance diet as tolerated.                           - Continue present medications.                           - Repeat colonoscopy date to be determined after                            pending pathology results are reviewed for                            surveillance. Procedure Code(s):        --- Professional ---                           [phone], Colonoscopy, flexible; with removal of                            tumor(s), polyp(s), or other lesion(s) by snare                            technique  60109, Moderate sedation; each additional 15                            minutes intraservice time                           G0500, Moderate sedation services provided by the                            same physician or other qualified health care                            professional performing a gastrointestinal                            endoscopic service that sedation supports,                            requiring the presence of an independent trained                            observer to assist in the monitoring of the                            patient's level of consciousness and physiological                            status; initial 15 minutes of intra-service time;                            patient age 17 years or older (additional time may                            be reported with [phone], as appropriate) Diagnosis Code(s):        --- Professional ---                           Z12.11,  Encounter for screening for malignant                            neoplasm of colon                           D12.5, Benign neoplasm of sigmoid colon                           D12.3, Benign neoplasm of transverse colon (hepatic                            flexure or splenic flexure)                           K64.0, First degree hemorrhoids CPT copyright 2018 American Medical Association. All rights reserved. The codes documented in this report are preliminary and upon coder review may  be revised to meet current  compliance requirements. Jerry Pacheco. Jerry Benham, MD Norvel Richards, MD 10/10/2018 12:00:01 PM This report has been signed electronically. Number of Addenda: 0

## 2018-10-10 NOTE — H&P (Signed)
@LOGO @   Primary Care Physician:  Dineen Kid, MD Primary Gastroenterologist:  Dr. Gala Romney  Pre-Procedure History & Physical: HPI:  ULICE Pacheco is a 53 y.o. male is here for a screening colonoscopy.  No prior colonoscopy.  No family history of colon cancer.  No GI symptoms.  Past Medical History:  Diagnosis Date  . Dysrhythmia    history of A-Fib    History reviewed. No pertinent surgical history.  Prior to Admission medications   Medication Sig Start Date End Date Taking? Authorizing Provider  acetaminophen (TYLENOL) 650 MG CR tablet Take 1,300 mg by mouth every 8 (eight) hours as needed for pain.   Yes [provider]  ibuprofen (ADVIL,MOTRIN) 200 MG tablet Take 400-600 mg by mouth every 8 (eight) hours as needed (PAIN/HEADACHE).   Yes [provider]    Allergies as of 07/03/2018  . (No Known Allergies)    History reviewed. No pertinent family history.  Social History   Socioeconomic History  . Marital status: Married    Spouse name: Not on file  . Number of children: Not on file  . Years of education: Not on file  . Highest education level: Not on file  Occupational History  . Not on file  Social Needs  . Financial resource strain: Not on file  . Food insecurity:    Worry: Not on file    Inability: Not on file  . Transportation needs:    Medical: Not on file    Non-medical: Not on file  Tobacco Use  . Smoking status: Never Smoker  . Smokeless tobacco: Never Used  Substance and Sexual Activity  . Alcohol use: No  . Drug use: No  . Sexual activity: Not on file  Lifestyle  . Physical activity:    Days per week: Not on file    Minutes per session: Not on file  . Stress: Not on file  Relationships  . Social connections:    Talks on phone: Not on file    Gets together: Not on file    Attends religious service: Not on file    Active member of club or organization: Not on file    Attends meetings of clubs or organizations: Not on file   Relationship status: Not on file  . Intimate partner violence:    Fear of current or ex partner: Not on file    Emotionally abused: Not on file    Physically abused: Not on file    Forced sexual activity: Not on file  Other Topics Concern  . Not on file  Social History Narrative  . Not on file    Review of Systems: See HPI, otherwise negative ROS  Physical Exam: BP 129/86   Pulse 60   Temp 98.3 F (36.8 C) (Oral)   Resp 16   Ht 6\' 1"  (1.854 m)   Wt 93 kg   SpO2 97%   BMI 27.05 kg/m  General:   Alert,  Well-developed, well-nourished, pleasant and cooperative in NAD l. Neck:  Supple; no masses or thyromegaly. Lungs:  Clear throughout to auscultation.   No wheezes, crackles, or rhonchi. No acute distress. Heart:  Regular rate and rhythm; no murmurs, clicks, rubs,  or gallops. Abdomen:  Soft, nontender and nondistended. No masses, hepatosplenomegaly or hernias noted. Normal bowel sounds, without guarding, and without rebound.    Impression/Plan: Jerry Pacheco is now here to undergo a screening colonoscopy.  First ever average risk screening examination.  Risks, benefits,  limitations, imponderables and alternatives regarding colonoscopy have been reviewed with the patient. Questions have been answered. All parties agreeable.     Notice:  This dictation was prepared with Dragon dictation along with smaller phrase technology. Any transcriptional errors that result from this process are unintentional and may not be corrected upon review.

## 2018-10-10 NOTE — Discharge Instructions (Signed)
°Colonoscopy °Discharge Instructions ° °Read the instructions outlined below and refer to this sheet in the next few weeks. These discharge instructions provide you with general information on caring for yourself after you leave the hospital. Your doctor may also give you specific instructions. While your treatment has been planned according to the most current medical practices available, unavoidable complications occasionally occur. If you have any problems or questions after discharge, call Dr. Rourk at [phone]. °ACTIVITY °· You may resume your regular activity, but move at a slower pace for the next 24 hours.  °· Take frequent rest periods for the next 24 hours.  °· Walking will help get rid of the air and reduce the bloated feeling in your belly (abdomen).  °· No driving for 24 hours (because of the medicine (anesthesia) used during the test).   °· Do not sign any important legal documents or operate any machinery for 24 hours (because of the anesthesia used during the test).  °NUTRITION °· Drink plenty of fluids.  °· You may resume your normal diet as instructed by your doctor.  °· Begin with a light meal and progress to your normal diet. Heavy or fried foods are harder to digest and may make you feel sick to your stomach (nauseated).  °· Avoid alcoholic beverages for 24 hours or as instructed.  °MEDICATIONS °· You may resume your normal medications unless your doctor tells you otherwise.  °WHAT YOU CAN EXPECT TODAY °· Some feelings of bloating in the abdomen.  °· Passage of more gas than usual.  °· Spotting of blood in your stool or on the toilet paper.  °IF YOU HAD POLYPS REMOVED DURING THE COLONOSCOPY: °· No aspirin products for 7 days or as instructed.  °· No alcohol for 7 days or as instructed.  °· Eat a soft diet for the next 24 hours.  °FINDING OUT THE RESULTS OF YOUR TEST °Not all test results are available during your visit. If your test results are not back during the visit, make an appointment  with your caregiver to find out the results. Do not assume everything is normal if you have not heard from your caregiver or the medical facility. It is important for you to follow up on all of your test results.  °SEEK IMMEDIATE MEDICAL ATTENTION IF: °· You have more than a spotting of blood in your stool.  °· Your belly is swollen (abdominal distention).  °· You are nauseated or vomiting.  °· You have a temperature over 101.  °· You have abdominal pain or discomfort that is severe or gets worse throughout the day.  ° ° °Colon polyp information provided ° °Further recommendations to follow pending review of pathology report ° ° °Colon Polyps ° °Polyps are tissue growths inside the body. Polyps can grow in many places, including the large intestine (colon). A polyp may be a round bump or a mushroom-shaped growth. You could have one polyp or several. °Most colon polyps are noncancerous (benign). However, some colon polyps can become cancerous over time. Finding and removing the polyps early can help prevent this. °What are the causes? °The exact cause of colon polyps is not known. °What increases the risk? °You are more likely to develop this condition if you: °· Have a family history of colon cancer or colon polyps. °· Are older than 50 or older than 45 if you are African American. °· Have inflammatory bowel disease, such as ulcerative colitis or Crohn's disease. °· Have certain hereditary conditions, such   as: °? Familial adenomatous polyposis. °? Lynch syndrome. °? Turcot syndrome. °? Peutz-Jeghers syndrome. °· Are overweight. °· Smoke cigarettes. °· Do not get enough exercise. °· Drink too much alcohol. °· Eat a diet that is high in fat and red meat and low in fiber. °· Had childhood cancer that was treated with abdominal radiation. °What are the signs or symptoms? °Most polyps do not cause symptoms. °If you have symptoms, they may include: °· Blood coming from your rectum when having a bowel movement. °· Blood in  your stool. The stool may look dark red or black. °· Abdominal pain. °· A change in bowel habits, such as constipation or diarrhea. °How is this diagnosed? °This condition is diagnosed with a colonoscopy. This is a procedure in which a lighted, flexible scope is inserted into the anus and then passed into the colon to examine the area. Polyps are sometimes found when a colonoscopy is done as part of routine cancer screening tests. °How is this treated? °Treatment for this condition involves removing any polyps that are found. Most polyps can be removed during a colonoscopy. Those polyps will then be tested for cancer. Additional treatment may be needed depending on the results of testing. °Follow these instructions at home: °Lifestyle °· Maintain a healthy weight, or lose weight if recommended by your health care provider. °· Exercise every day or as told by your health care provider. °· Do not use any products that contain nicotine or tobacco, such as cigarettes and e-cigarettes. If you need help quitting, ask your health care provider. °· If you drink alcohol, limit how much you have: °? 0-1 drink a day for women. °? 0-2 drinks a day for men. °· Be aware of how much alcohol is in your drink. In the [place] one drink equals one 12 oz bottle of beer (355 mL), one 5 oz glass of wine (148 mL), or one 1½ oz shot of hard liquor (44 mL). °Eating and drinking ° °· Eat foods that are high in fiber, such as fruits, vegetables, and whole grains. °· Eat foods that are high in calcium and vitamin D, such as milk, cheese, yogurt, eggs, liver, fish, and broccoli. °· Limit foods that are high in fat, such as fried foods and desserts. °· Limit the amount of red meat and processed meat you eat, such as hot dogs, sausage, bacon, and lunch meats. °General instructions °· Keep all follow-up visits as told by your health care provider. This is important. °? This includes having regularly scheduled colonoscopies. °? Talk to your health  care provider about when you need a colonoscopy. °Contact a health care provider if: °· You have new or worsening bleeding during a bowel movement. °· You have new or increased blood in your stool. °· You have a change in bowel habits. °· You lose weight for no known reason. °Summary °· Polyps are tissue growths inside the body. Polyps can grow in many places, including the colon. °· Most colon polyps are noncancerous (benign), but some can become cancerous over time. °· This condition is diagnosed with a colonoscopy. °· Treatment for this condition involves removing any polyps that are found. Most polyps can be removed during a colonoscopy. °This information is not intended to replace advice given to you by your health care provider. Make sure you discuss any questions you have with your health care provider. °Document Released: 06/15/2004 Document Revised: 01/04/2018 Document Reviewed: 01/04/2018 °Elsevier Interactive Patient Education © 2019 [employer] Inc[employer] ° °

## 2018-10-11 ENCOUNTER — Encounter: Payer: Self-pay | Admitting: Internal Medicine

## 2018-10-15 ENCOUNTER — Encounter (HOSPITAL_COMMUNITY): Payer: Self-pay | Admitting: Internal Medicine

## 2018-11-02 ENCOUNTER — Encounter: Payer: Self-pay | Admitting: Orthopedic Surgery

## 2018-11-02 ENCOUNTER — Ambulatory Visit: Payer: 59 | Admitting: Orthopedic Surgery

## 2018-11-02 ENCOUNTER — Ambulatory Visit (INDEPENDENT_AMBULATORY_CARE_PROVIDER_SITE_OTHER): Payer: 59

## 2018-11-02 VITALS — BP 143/90 | HR 59 | Ht 73.0 in | Wt 216.0 lb

## 2018-11-02 DIAGNOSIS — M25511 Pain in right shoulder: Secondary | ICD-10-CM

## 2018-11-02 DIAGNOSIS — G8929 Other chronic pain: Secondary | ICD-10-CM

## 2018-11-02 DIAGNOSIS — M722 Plantar fascial fibromatosis: Secondary | ICD-10-CM

## 2018-11-02 DIAGNOSIS — M7581 Other shoulder lesions, right shoulder: Secondary | ICD-10-CM | POA: Diagnosis not present

## 2018-11-02 DIAGNOSIS — M778 Other enthesopathies, not elsewhere classified: Secondary | ICD-10-CM

## 2018-11-02 NOTE — Progress Notes (Signed)
NEW Problem//OFFICE VISIT  Chief Complaint  Patient presents with  . Shoulder Pain    right   . 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  He complains of pain over the right shoulder joint which is dull moderate activity related with increased pain after work in the evenings.  No catching sensations no locking   Review of Systems  Constitutional: Negative for fever.  Respiratory: Negative for shortness of breath.   Cardiovascular: Negative for chest pain.  Skin: Negative.   Neurological: Negative for tingling and sensory change.     Past Medical History:  Diagnosis Date  . Dysrhythmia    history of A-Fib    Past Surgical History:  Procedure Laterality Date  . COLONOSCOPY N/A 10/10/2018   Procedure: COLONOSCOPY;  Surgeon: Daneil Dolin, MD;  Location: [facility];  Service: Endoscopy;  Laterality: N/A;  1:00  . POLYPECTOMY  10/10/2018   Procedure: POLYPECTOMY;  Surgeon: Daneil Dolin, MD;  Location: [facility];  Service: Endoscopy;;  hepatic flexure, sigmoid    Family History  Problem Relation Age of Onset  . Healthy Mother   . Healthy Father    Social History   Tobacco Use  . Smoking status: Never Smoker  . Smokeless tobacco: Never Used  Substance Use Topics  . Alcohol use: No  . Drug use: No    No Known Allergies  Current Meds  Medication Sig  . acetaminophen (TYLENOL) 650 MG CR tablet Take 1,300 mg by mouth every 8 (eight) hours as needed for pain.    BP (!) 143/90   Pulse (!) 59   Ht 6\' 1"  (1.854 m)   Wt 216 lb (98 kg)   BMI 28.50 kg/m   Physical Exam Constitutional:      Appearance: He is well-developed.  Neurological:     Mental Status: He is alert and oriented to person, place, and time.  Psychiatric:        Behavior: Behavior normal.        Thought  Content: Thought content normal.        Judgment: Judgment normal.     Right Shoulder Exam  Right shoulder exam is normal.  Tenderness  The patient is experiencing no tenderness.  Range of Motion  The patient has normal right shoulder ROM.  Muscle Strength  The patient has normal right shoulder strength (Pain with resisted abduction was noted).  Tests  Apprehension: negative Hawkins test: negative Cross arm: negative Impingement: negative Drop arm: negative Sulcus: absent  Other  Erythema: absent Sensation: normal Pulse: present   Left Shoulder Exam  Left shoulder exam is normal.  Tenderness  The patient is experiencing no tenderness.   Range of Motion  The patient has normal left shoulder ROM.  Muscle Strength  The patient has normal left shoulder strength.  Tests  Apprehension: negative  Other  Erythema: absent Sensation: normal Pulse: present      Addendum right foot  The patient has tenderness in the plantar aspect of his right foot normal arch normal dorsiflexion Neuro vascular exam intact  MEDICAL DECISION SECTION  Xrays were done at [facility] orth[facility]  My independent reading of xrays:  Normal x-ray see separate report  Encounter Diagnoses  Name Primary?  . Chronic pain in  right shoulder   . Plantar fasciitis   . Right shoulder tendonitis Yes    PLAN: (Rx., injectx, surgery, frx, mri/ct) Procedure note Inject plantar fascia RIGHT HEEL   Timeout was completed to confirm the site of injection  The medications used were 40 mg of Depo-Medrol and 1% lidocaine 3 cc  Anesthesia was provided by ethyl chloride and the skin was prepped with alcohol.  After cleaning the skin with alcohol a 25-gauge needle was used to inject the plantar fascia, no complications were noted sterile bandage was applied   Procedure note the subacromial injection shoulder RIGHT  Verbal consent was obtained to inject the  RIGHT   Shoulder  Timeout was  completed to confirm the injection site is a subacromial space of the  RIGHT  shoulder   Medication used Depo-Medrol 40 mg and lidocaine 1% 3 cc  Anesthesia was provided by ethyl chloride  The injection was performed in the RIGHT  posterior subacromial space. After pinning the skin with alcohol and anesthetized the skin with ethyl chloride the subacromial space was injected using a 20-gauge needle. There were no complications  Sterile dressing was applied.    No orders of the defined types were placed in this encounter.   Arther Abbott, MD  11/02/2018 10:27 AM

## 2018-11-02 NOTE — Patient Instructions (Addendum)
Plantar Fasciitis  Plantar fasciitis is a painful foot condition that affects the heel. It occurs when the band of tissue that connects the toes to the heel bone (plantar fascia) becomes irritated. This can happen as the result of exercising too much or doing other repetitive activities (overuse injury). The pain from plantar fasciitis can range from mild irritation to severe pain that makes it difficult to walk or move. The pain is usually worse in the morning after sleeping, or after sitting or lying down for a while. Pain may also be worse after long periods of walking or standing. What are the causes? This condition may be caused by:  Standing for long periods of time.  Wearing shoes that do not have good arch support.  Doing activities that put stress on joints (high-impact activities), including running, aerobics, and ballet.  Being overweight.  An abnormal way of walking (gait).  Tight muscles in the back of your lower leg (calf).  High arches in your feet.  Starting a new athletic activity. What are the signs or symptoms? The main symptom of this condition is heel pain. Pain may:  Be worse with first steps after a time of rest, especially in the morning after sleeping or after you have been sitting or lying down for a while.  Be worse after long periods of standing still.  Decrease after 30-45 minutes of activity, such as gentle walking. How is this diagnosed? This condition may be diagnosed based on your medical history and your symptoms. Your health care provider may ask questions about your activity level. Your health care provider will do a physical exam to check for:  A tender area on the bottom of your foot.  A high arch in your foot.  Pain when you move your foot.  Difficulty moving your foot. You may have imaging tests to confirm the diagnosis, such as:  X-rays.  Ultrasound.  MRI. How is this treated? Treatment for plantar fasciitis depends on how  severe your condition is. Treatment may include:  Rest, ice, applying pressure (compression), and raising the affected foot (elevation). This may be called RICE therapy. Your health care provider may recommend RICE therapy along with over-the-counter pain medicines to manage your pain.  Exercises to stretch your calves and your plantar fascia.  A splint that holds your foot in a stretched, upward position while you sleep (night splint).  Physical therapy to relieve symptoms and prevent problems in the future.  Injections of steroid medicine (cortisone) to relieve pain and inflammation.  Stimulating your plantar fascia with electrical impulses (extracorporeal shock wave therapy). This is usually the last treatment option before surgery.  Surgery, if other treatments have not worked after 12 months. Follow these instructions at home:  Managing pain, stiffness, and swelling  If directed, put ice on the painful area: ? Put ice in a plastic bag, or use a frozen bottle of water. ? Place a towel between your skin and the bag or bottle. ? Roll the bottom of your foot over the bag or bottle. ? Do this for 20 minutes, 2-3 times a day.  Wear athletic shoes that have air-sole or gel-sole cushions, or try wearing soft shoe inserts that are designed for plantar fasciitis.  Raise (elevate) your foot above the level of your heart while you are sitting or lying down. Activity  Avoid activities that cause pain. Ask your health care provider what activities are safe for you.  Do physical therapy exercises and stretches as told   by your health care provider.  Try activities and forms of exercise that are easier on your joints (low-impact). Examples include swimming, water aerobics, and biking. General instructions  Take over-the-counter and prescription medicines only as told by your health care provider.  Wear a night splint while sleeping, if told by your health care provider. Loosen the splint  if your toes tingle, become numb, or turn cold and blue.  Maintain a healthy weight, or work with your health care provider to lose weight as needed.  Keep all follow-up visits as told by your health care provider. This is important. Contact a health care provider if you:  Have symptoms that do not go away after caring for yourself at home.  Have pain that gets worse.  Have pain that affects your ability to move or do your daily activities. Summary  Plantar fasciitis is a painful foot condition that affects the heel. It occurs when the band of tissue that connects the toes to the heel bone (plantar fascia) becomes irritated.  The main symptom of this condition is heel pain that may be worse after exercising too much or standing still for a long time.  Treatment varies, but it usually starts with rest, ice, compression, and elevation (RICE therapy) and over-the-counter medicines to manage pain. This information is not intended to replace advice given to you by your health care provider. Make sure you discuss any questions you have with your health care provider. Document Released: 06/14/2001 Document Revised: 07/17/2017 Document Reviewed: 07/17/2017 Elsevier Interactive Patient Education  2019 [employer].   These are the muscle and arthrits creams I recommend:  PLEASE READ THE PACKAGE INSTRUCTIONS BEFORE USING   Ben Gay arthritis cream  Icy hot vanishing gel  Aspercreme odor free  Myoflex Oderless pain reliever  Capzasin  Sportscreme  Max freeze

## 2018-12-19 ENCOUNTER — Other Ambulatory Visit: Payer: Self-pay

## 2018-12-19 ENCOUNTER — Encounter: Payer: Self-pay | Admitting: Orthopedic Surgery

## 2018-12-19 ENCOUNTER — Ambulatory Visit: Payer: 59 | Admitting: Orthopedic Surgery

## 2018-12-19 VITALS — BP 140/88 | HR 66 | Ht 73.0 in | Wt 210.0 lb

## 2018-12-19 DIAGNOSIS — G8929 Other chronic pain: Secondary | ICD-10-CM | POA: Diagnosis not present

## 2018-12-19 DIAGNOSIS — M25511 Pain in right shoulder: Secondary | ICD-10-CM

## 2018-12-19 DIAGNOSIS — M75111 Incomplete rotator cuff tear or rupture of right shoulder, not specified as traumatic: Secondary | ICD-10-CM

## 2018-12-19 NOTE — Progress Notes (Signed)
Progress Note   Patient ID: Jerry Pacheco, male   DOB: 11/14/1965, 53 y.o.   MRN: [id number]   Chief Complaint  Patient presents with  . 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  He complains of pain over the right shoulder joint which is dull moderate activity related with increased pain after work in the evenings.  No catching sensations no locking      Review of Systems  Musculoskeletal: Negative for neck pain.  Neurological: Negative for tingling.     No Known Allergies   BP 140/88   Pulse 66   Ht 6\' 1"  (1.854 m)   Wt 210 lb (95.3 kg)   BMI 27.71 kg/m   Physical Exam Vitals signs and nursing note reviewed.  Constitutional:      Appearance: Normal appearance.  Musculoskeletal:     Right shoulder: He exhibits decreased range of motion, tenderness, crepitus, pain and decreased strength. He exhibits no swelling, no deformity, no laceration, no spasm and normal pulse.  Neurological:     Mental Status: He is alert and oriented to person, place, and time.  Psychiatric:        Mood and Affect: Mood normal.    Positive Hawkins impingement maneuver Supraspinatus weakness  Medical decisions:   Data  Imaging:   Type I acromion   [facility]   Dictated by Dr. Aline Brochure     Chief complaint   RIGHT SHOULDER PAIN    Normal bone quality acromion is type I humeral head is normal and contour glenohumeral joint looks normal, Shenton's line of the shoulder looks normal.  The greater tuberosity have extensive sclerosis no cyst formation   Impression normal shoulder joint type I acromion greater tuberosity sclerosis may be  age-related or from chronic stress on the cuff     Encounter Diagnoses  Name Primary?  . Chronic right shoulder pain   . Incomplete tear of right rotator cuff, unspecified whether traumatic Yes    PLAN:   RECOMMEND MRI RIGHT SHOULDER     Arther Abbott, MD 12/19/2018 10:59 AM

## 2019-01-25 ENCOUNTER — Ambulatory Visit (HOSPITAL_COMMUNITY): Payer: 59

## 2019-02-12 DIAGNOSIS — M722 Plantar fascial fibromatosis: Secondary | ICD-10-CM | POA: Diagnosis not present

## 2019-02-12 DIAGNOSIS — M79671 Pain in right foot: Secondary | ICD-10-CM | POA: Diagnosis not present

## 2019-02-16 MED FILL — MELOXICAM 15 MG TABLET: 15 | 30 days supply | Qty: 30 | Fill #0

## 2019-02-20 ENCOUNTER — Other Ambulatory Visit: Payer: Self-pay

## 2019-02-20 ENCOUNTER — Telehealth: Payer: Self-pay | Admitting: Orthopedic Surgery

## 2019-02-20 ENCOUNTER — Ambulatory Visit (HOSPITAL_COMMUNITY)
Admission: RE | Admit: 2019-02-20 | Discharge: 2019-02-20 | Disposition: A | Discharge status: Home / Self Care | Payer: 59 | Source: Ambulatory Visit | Attending: Orthopedic Surgery | Admitting: Orthopedic Surgery

## 2019-02-20 DIAGNOSIS — M25511 Pain in right shoulder: Secondary | ICD-10-CM | POA: Insufficient documentation

## 2019-02-20 DIAGNOSIS — G8929 Other chronic pain: Secondary | ICD-10-CM | POA: Insufficient documentation

## 2019-02-20 NOTE — Telephone Encounter (Signed)
I called Jerry Pacheco at about 437 on Wednesday, May 20.  I called him to give him his MRI results.  I asked him to call back and let me know a time when I can call him on Thursday to discuss the results

## 2019-03-04 ENCOUNTER — Ambulatory Visit (HOSPITAL_COMMUNITY): Payer: 59

## 2019-03-23 MED FILL — MELOXICAM 15 MG TABLET: 15 | 30 days supply | Qty: 30 | Fill #1

## 2019-03-29 ENCOUNTER — Ambulatory Visit (INDEPENDENT_AMBULATORY_CARE_PROVIDER_SITE_OTHER): Payer: 59

## 2019-03-29 ENCOUNTER — Other Ambulatory Visit: Payer: Self-pay

## 2019-03-29 ENCOUNTER — Ambulatory Visit: Payer: 59 | Admitting: Orthopedic Surgery

## 2019-03-29 VITALS — BP 145/89 | HR 64 | Temp 97.3°F | Ht 73.0 in | Wt 205.0 lb

## 2019-03-29 DIAGNOSIS — M25521 Pain in right elbow: Secondary | ICD-10-CM

## 2019-03-29 DIAGNOSIS — M7711 Lateral epicondylitis, right elbow: Secondary | ICD-10-CM

## 2019-03-29 MED ORDER — MELOXICAM 15 MG PO TABS: 15.0000 mg | ORAL_TABLET | Freq: Every day | ORAL | 5 refills | Status: DC

## 2019-03-29 NOTE — Progress Notes (Signed)
NEW PROBLEM OFFICE VISIT  Chief Complaint  Patient presents with  . Elbow Pain    Right elbow pain.    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  He reports difficulty with power grip activities which increase the pain  Severity mild to moderate duration as described timing intermittent associated symptoms mild weakness with power grip   Review of Systems  Musculoskeletal:       Rotator cuff tendinitis improved with meloxicam  All other systems reviewed and are negative.    Past Medical History:  Diagnosis Date  . Dysrhythmia    history of A-Fib    Past Surgical History:  Procedure Laterality Date  . COLONOSCOPY N/A 10/10/2018   Procedure: COLONOSCOPY;  Surgeon: Daneil Dolin, MD;  [facility];  Service: Endoscopy;  Laterality: N/A;  1:00  . POLYPECTOMY  10/10/2018   Procedure: POLYPECTOMY;  Surgeon: Daneil Dolin, MD;  [facility]: [facility];  Service: Endoscopy;;  hepatic flexure, sigmoid    Family History  Problem Relation Age of Onset  . Healthy Mother   . Healthy Father    Social History   Tobacco Use  . Smoking status: Never Smoker  . Smokeless tobacco: Never Used  Substance Use Topics  . Alcohol use: No  . Drug use: No    No Known Allergies  No outpatient medications have been marked as taking for the 03/29/19 encounter (Office Visit) with Carole Civil, MD.    BP (!) 145/89   Pulse 64   Temp (!) 97.3 F (36.3 C)   Ht 6\' 1"  (1.854 m)   Wt 205 lb (93 kg)   BMI 27.05 kg/m   Physical Exam Vitals signs and nursing note reviewed.  Constitutional:      Appearance: Normal appearance.  Neurological:     Mental Status: He is alert and oriented to person, place, and time.  Psychiatric:         Mood and Affect: Mood normal.     Ortho Exam Left arm normal alignment normal range of motion stability strength neurovascular intact skin normal  Right elbow skin normal no epitrochlear lymph node enlargement neurovascular exam intact skin normal  Tenderness over the lateral epicondyle full range of motion ligaments stable  Finger extension test was positive wrist extension test against resistance was positive for pain in the lateral epicondyle   MEDICAL DECISION SECTION  Xrays were done at [facility]  My independent reading of xrays:  Normal x-ray  Encounter Diagnoses  Name Primary?  . Right elbow pain   . Right tennis elbow Yes    PLAN: (Rx., injectx, surgery, frx, mri/ct) .injection   Procedure note injection for right tennis elbow   Diagnosis right tennis elbow  Anesthesia ethyl chloride was used Alcohol use is clean the skin  After we obtained verbal consent and timeout a 25-gauge needle was used to inject 40 mg of Depo-Medrol and 3 cc of 1% lidocaine just distal to the insertion of the ECRB  There were no complications and a sterile bandage was applied.   Wear tennis elbow brace at work   Take meloxicam   Ice the elbow every night   Do the x-ray exercises once a day  Follow-up 6 weeks  Meds ordered this encounter  Medications  . meloxicam (MOBIC) 15 MG tablet    Sig: Take 1 tablet (15 mg total) by mouth daily.    Dispense:  30 tablet    Refill:  5    Arther Abbott, MD  03/29/2019 9:39 AM

## 2019-03-29 NOTE — Patient Instructions (Signed)
   Wear tennis elbow brace at work   Take meloxicam   Ice the elbow every night   Do the x-ray exercises once a day   Tennis Elbow Tennis elbow is swelling (inflammation) in your outer forearm, near your elbow. Swelling affects the tissues that connect muscle to bone (tendons). Tennis elbow can happen in any sport or job in which you use your elbow too much. It is caused by doing the same motion over and over. Tennis elbow can cause:  Pain and tenderness in your forearm and the outer part of your elbow. You may have pain all the time, or only when using the arm.  A burning feeling. This runs from your elbow through your arm.  Weak grip in your hand. Follow these instructions at home: Activity  Rest your elbow and wrist. Avoid activities that cause problems, as told by your doctor.  If told by your doctor, wear an elbow strap to reduce stress on the area.  Do physical therapy exercises as told.  If you lift an object, lift it with your palm facing up. This is easier on your elbow. Lifestyle  If your tennis elbow is caused by sports, check your equipment and make sure that: ? You are using it correctly. ? It fits you well.  If your tennis elbow is caused by work or by using a computer, take breaks often to stretch your arm. Talk with your manager about how you can manage your condition at work. If you have a brace:  Wear the brace as told by your doctor. Remove it only as told by your doctor.  Loosen the brace if your fingers tingle, get numb, or turn cold and blue.  Keep the brace clean.  If the brace is not waterproof, ask your doctor if you may take the brace off for bathing. If you must keep the brace on while bathing: ? Do not let it get wet. ? Cover it with a watertight covering when you take a bath or a shower. General instructions   If told, put ice on the painful area: ? Put ice in a plastic bag. ? Place a towel between your skin and the bag. ? Leave the ice on  for 20 minutes, 2-3 times a day.  Take over-the-counter and prescription medicines only as told by your doctor.  Keep all follow-up visits as told by your doctor. This is important. Contact a doctor if:  Your pain does not get better with treatment.  Your pain gets worse.  You have weakness in your forearm, hand, or fingers.  You cannot feel your forearm, hand, or fingers. Summary  Tennis elbow is swelling (inflammation) in your outer forearm, near your elbow.  Tennis elbow is caused by doing the same motion over and over.  Rest your elbow and wrist. Avoid activities that cause problems, as told by your doctor.  If told, put ice on the painful area for 20 minutes, 2-3 times a day. This information is not intended to replace advice given to you by your health care provider. Make sure you discuss any questions you have with your health care provider. Document Released: 03/09/2010 Document Revised: 07/04/2017 Document Reviewed: 07/04/2017 Elsevier Interactive Patient Education  2019 [employer].

## 2019-04-01 ENCOUNTER — Ambulatory Visit: Payer: 59 | Admitting: Orthopedic Surgery

## 2019-04-26 MED FILL — MELOXICAM 15 MG TABLET: 15 | 30 days supply | Qty: 30 | Fill #0

## 2019-05-13 ENCOUNTER — Ambulatory Visit: Payer: 59 | Admitting: Orthopedic Surgery

## 2019-06-04 MED FILL — MELOXICAM 15 MG TABLET: 15 | 30 days supply | Qty: 30 | Fill #1

## 2019-07-05 MED FILL — MELOXICAM 15 MG TABLET: 15 | 30 days supply | Qty: 30 | Fill #2

## 2019-07-08 ENCOUNTER — Ambulatory Visit: Payer: 59 | Admitting: Orthopedic Surgery

## 2019-07-08 ENCOUNTER — Other Ambulatory Visit: Payer: Self-pay

## 2019-07-08 ENCOUNTER — Encounter: Payer: Self-pay | Admitting: Orthopedic Surgery

## 2019-07-08 VITALS — BP 123/80 | HR 61 | Ht 73.0 in | Wt 207.8 lb

## 2019-07-08 DIAGNOSIS — M7711 Lateral epicondylitis, right elbow: Secondary | ICD-10-CM

## 2019-07-08 DIAGNOSIS — M25521 Pain in right elbow: Secondary | ICD-10-CM | POA: Diagnosis not present

## 2019-07-08 DIAGNOSIS — M19011 Primary osteoarthritis, right shoulder: Secondary | ICD-10-CM

## 2019-07-08 MED ORDER — MELOXICAM 15 MG PO TABS: 15.0000 mg | ORAL_TABLET | Freq: Every day | ORAL | 5 refills | Status: AC

## 2019-07-08 NOTE — Progress Notes (Signed)
Progress Note   Patient ID: Jerry Pacheco, male   DOB: 21-Feb-1966, 53 y.o.   MRN: [id number]   Chief Complaint  Patient presents with  . Follow-up    shoulder pain still same out of meloxicam last dose friday     Encounter Diagnoses  Name Primary?  . Right elbow pain   . Right tennis elbow     53 yo male with right shoulder pain, had an MRI which showed tendinitis and bulky AC joint arthritis  He is had a subacromial injection has had some anti-inflammatories using meloxicam and he did reasonably well but he ran out of his meloxicam he still having discomfort when he works especially with reaching his arm across his chest.  He seems to do okay lifting his arm straight up but has some pain with abduction    Review of Systems  Constitutional: Negative for chills and fever.  Neurological: Negative for tingling, sensory change, focal weakness and weakness.    BP 123/80   Pulse 61   Ht 6\' 1"  (1.854 m)   Wt 207 lb 12.8 oz (94.3 kg)   BMI 27.42 kg/m   Physical Exam Vitals signs and nursing note reviewed.  Constitutional:      Appearance: Normal appearance.  Neurological:     Mental Status: He is alert and oriented to person, place, and time.  Psychiatric:        Mood and Affect: Mood normal.   Right shoulder is tender over the acromioclavicular joint.  He still has full range of motion.  He has pain reaching across his chest he has pain loading the [facility] joint shoulder is otherwise stable rotator cuff strength is normal in abduction and flexion skin is warm dry and intact without rash normal color perfusion of the right upper extremity with no lymphadenopathy in the axilla supraclavicular region there are no sensory deficits.  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  . Right elbow pain   . Right tennis elbow     PLAN:   AC joint injection Home exercises Refill meloxicam Follow-up 3 months  Procedure:  Inject AC JOINT  Right shoulder   Acromioclavicular joint injection Verbal consent was obtained Site marking was confirmed and timeout TAKEN TO CONFIRM AC JOINT INJECTION ON THE right shoulder Medications used: Depo-Medrol 40 mg and lidocaine 1% 3 mL  Injection technique: The skin was prepared with alcohol and ethyl chloride and  the right acromioclavicular joint was injected with a [id number] needle  No complications were noted    Arther Abbott, MD 07/08/2019 4:41 PM

## 2019-07-08 NOTE — Patient Instructions (Signed)
meloxicam   Home exercises 3 x a week   Injection given  You have received an injection of steroids into the joint. 15% of patients will have increased pain within the 24 hours postinjection.   This is transient and will go away.   We recommend that you use ice packs on the injection site for 20 minutes every 2 hours and extra strength Tylenol 2 tablets every 8 as needed until the pain resolves.  If you continue to have pain after taking the Tylenol and using the ice please call the office for further instructions.

## 2019-07-12 DIAGNOSIS — M778 Other enthesopathies, not elsewhere classified: Secondary | ICD-10-CM | POA: Diagnosis not present

## 2019-07-12 DIAGNOSIS — M79671 Pain in right foot: Secondary | ICD-10-CM | POA: Diagnosis not present

## 2019-07-12 DIAGNOSIS — M25571 Pain in right ankle and joints of right foot: Secondary | ICD-10-CM | POA: Diagnosis not present

## 2019-08-19 DIAGNOSIS — Z20828 Contact with and (suspected) exposure to other viral communicable diseases: Secondary | ICD-10-CM | POA: Diagnosis not present

## 2019-08-19 MED FILL — MELOXICAM 15 MG TABLET: 15 | 30 days supply | Qty: 30 | Fill #3

## 2019-10-02 MED FILL — MELOXICAM 15 MG TABLET: 15 | 30 days supply | Qty: 30 | Fill #4

## 2019-10-09 ENCOUNTER — Ambulatory Visit: Payer: 59 | Admitting: Orthopedic Surgery

## 2019-10-09 DIAGNOSIS — H10412 Chronic giant papillary conjunctivitis, left eye: Secondary | ICD-10-CM | POA: Diagnosis not present

## 2019-10-09 MED FILL — NEO/POLYMYXIN/DEXAMETH DROP: 3.5-10000-0 | 8 days supply | Qty: 5 | Fill #0

## 2019-11-13 MED FILL — MELOXICAM 15 MG TABLET: 15 | 30 days supply | Qty: 30 | Fill #5

## 2019-12-10 ENCOUNTER — Telehealth: Payer: Self-pay | Admitting: Orthopedic Surgery

## 2019-12-10 NOTE — Telephone Encounter (Signed)
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

## 2020-11-18 IMAGING — MR MRI OF THE RIGHT SHOULDER WITHOUT CONTRAST
4 of 5 series · 19 of 40 positions shown · non-contrast
Comparison: None.

CLINICAL DATA: Right shoulder pain for 1 year.  No known injury.

EXAM:
MRI OF THE RIGHT SHOULDER WITHOUT CONTRAST
TECHNIQUE: Multiplanar, multisequence MR imaging of the shoulder was performed.
No intravenous contrast was administered.

[Series 3: PD fat-sat · axial · 4.0mm · 0.31mm/px · z∈[-38,+75]mm · 8 of 25 slices shown (1 of 2)]
[im 1/25]
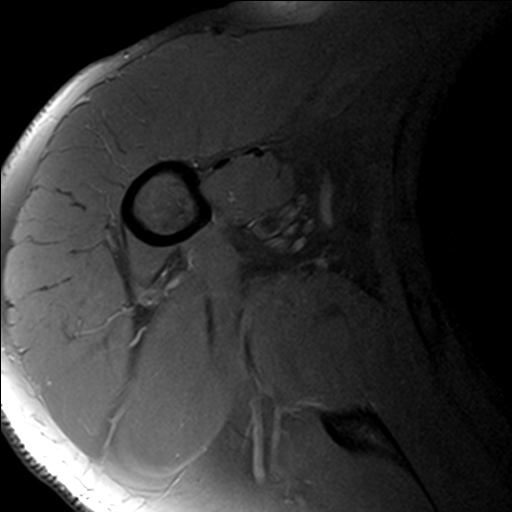
[im 3/25]
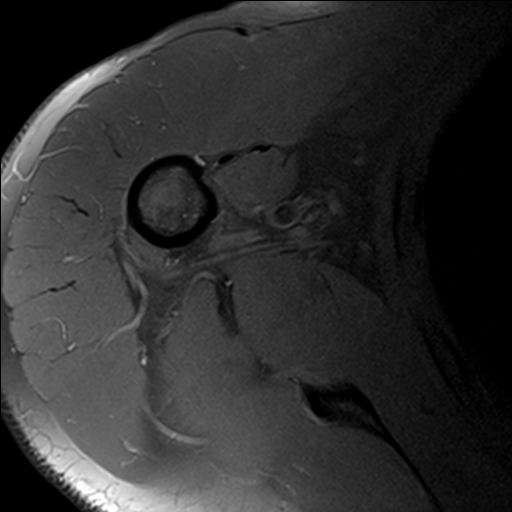
[im 9/25]
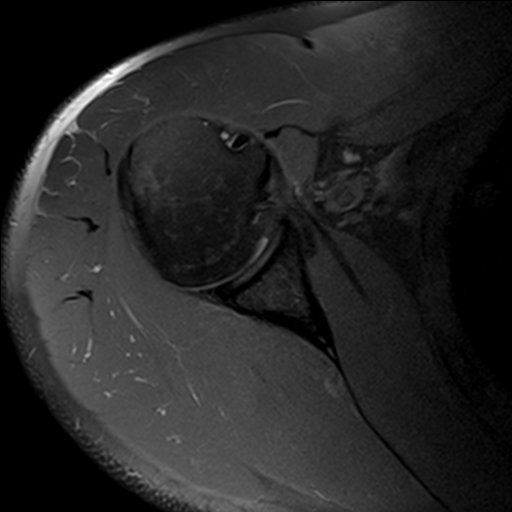
[im 11/25]
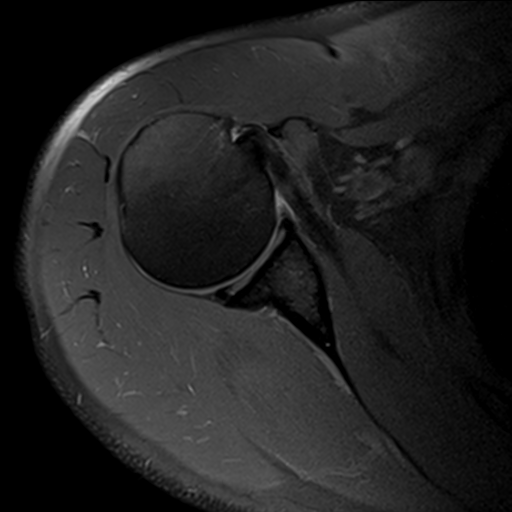
[im 14/25]
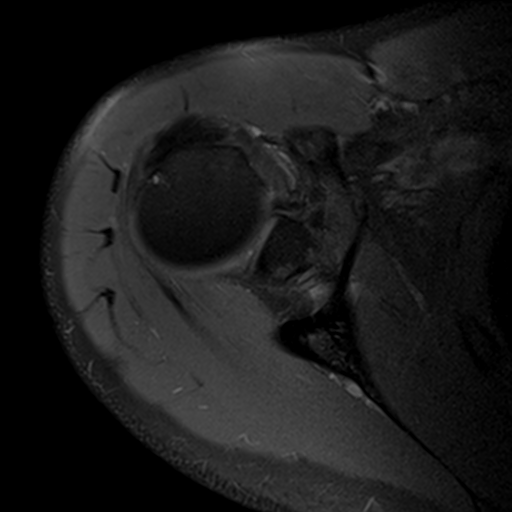
[im 17/25]
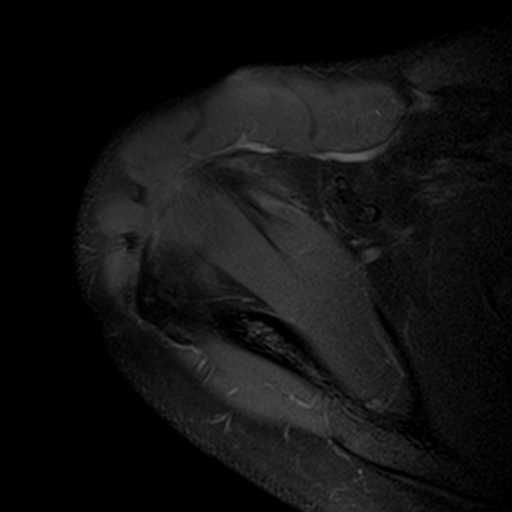
[im 22/25]
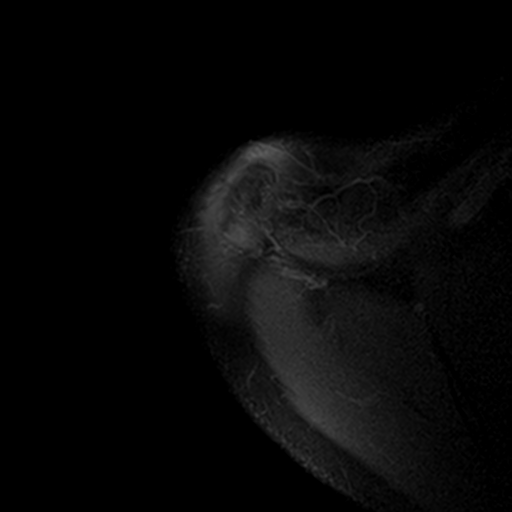
[im 25/25]
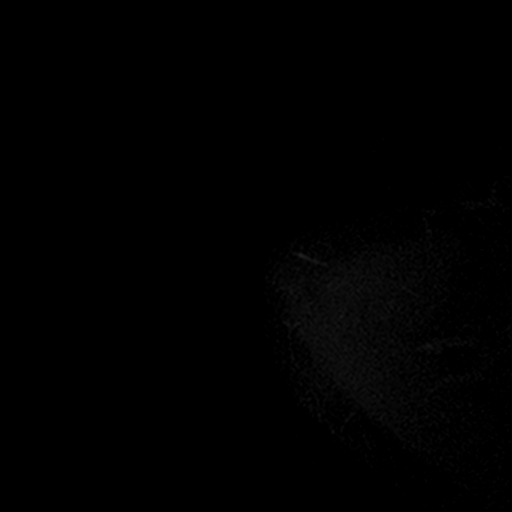

[Series 5: T2 fat-sat · oblique · 4.0mm · 0.27mm/px · 3 of 19 slices shown (1 of 2)]
[im 4/19]
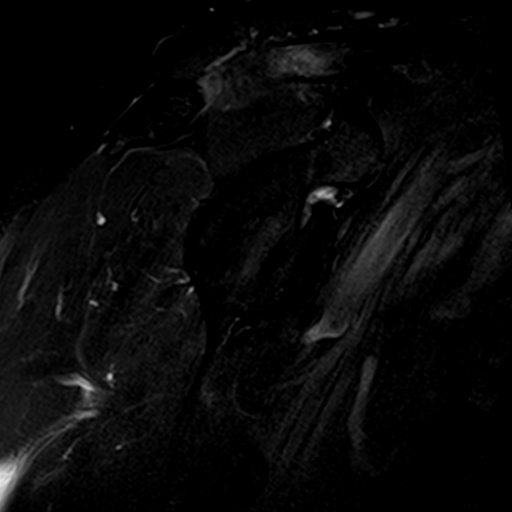
[im 10/19]
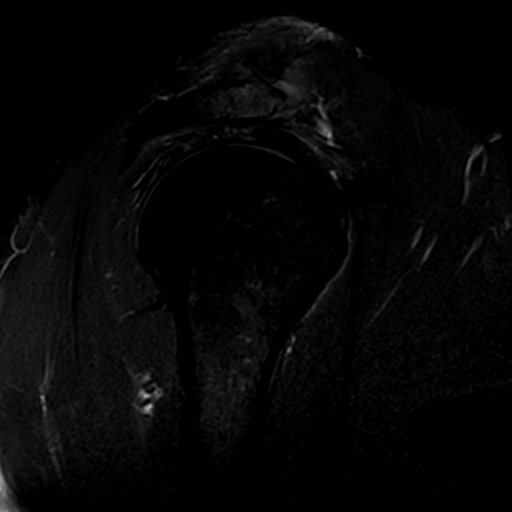
[im 16/19]
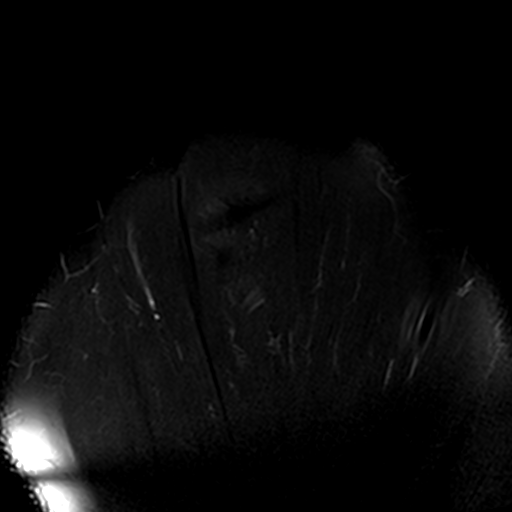

[Series 6: T2 fat-sat · oblique · 4.0mm · 0.28mm/px · 3 of 20 slices shown (2 of 2)]
[im 3/20]
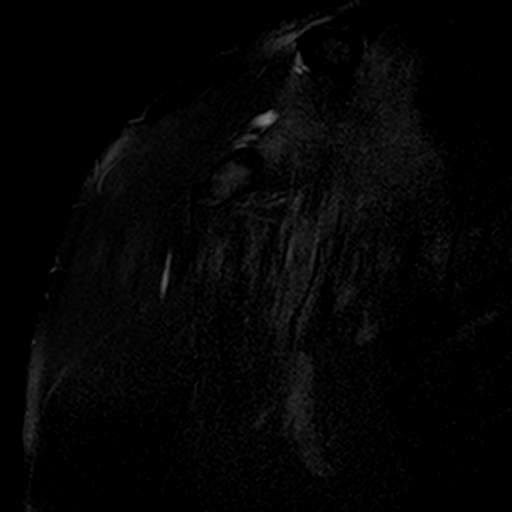
[im 11/20]
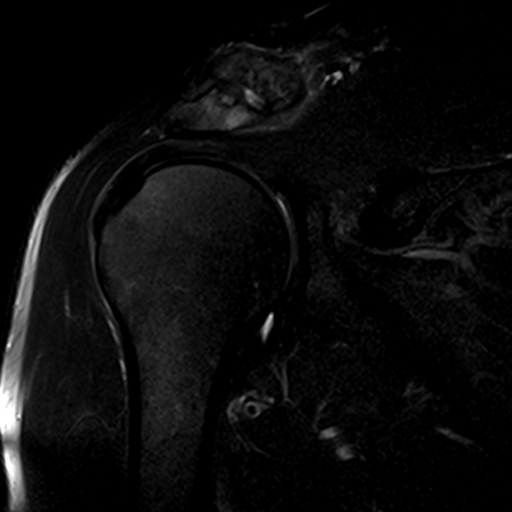
[im 17/20]
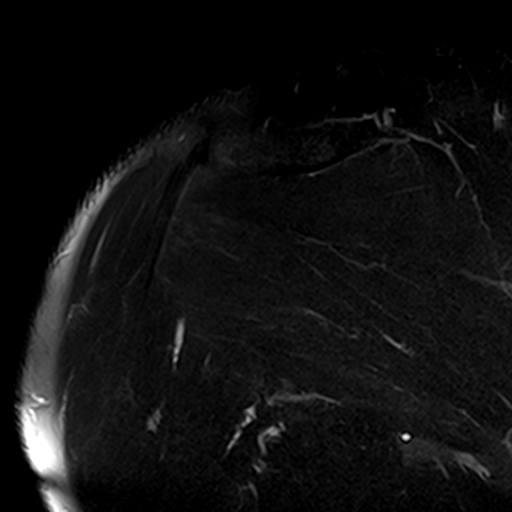

[Series 7: PD fat-sat · oblique · 4.0mm · 0.28mm/px · 5 of 20 slices shown (2 of 2)]
[im 1/20]
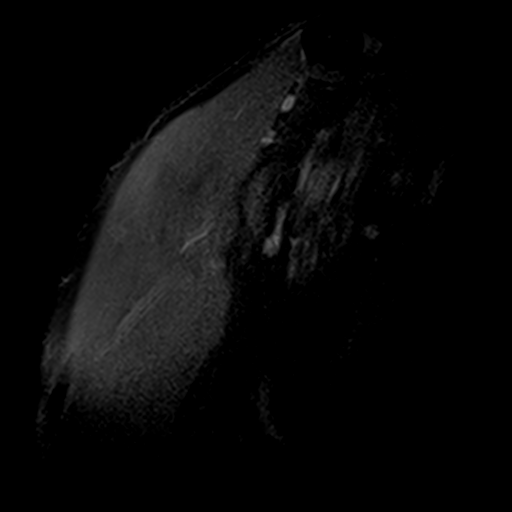
[im 3/20]
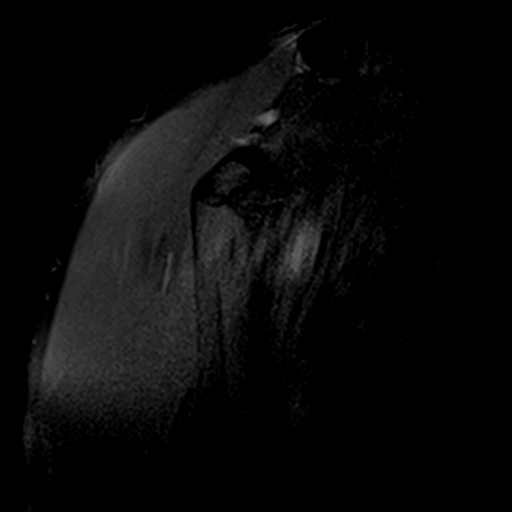
[im 6/20]
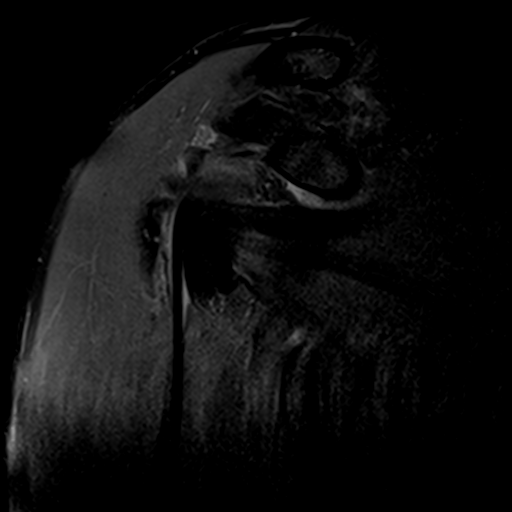
[im 11/20]
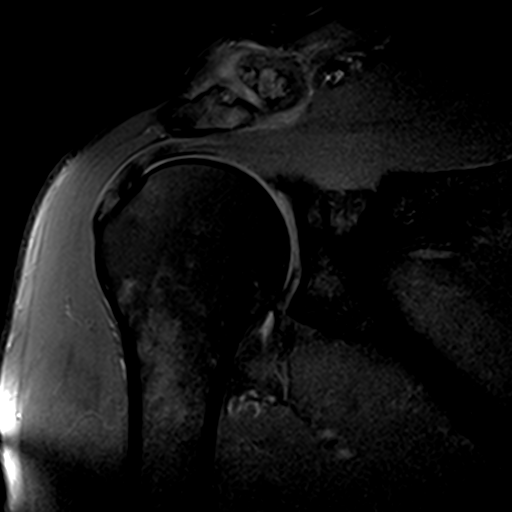
[im 17/20]
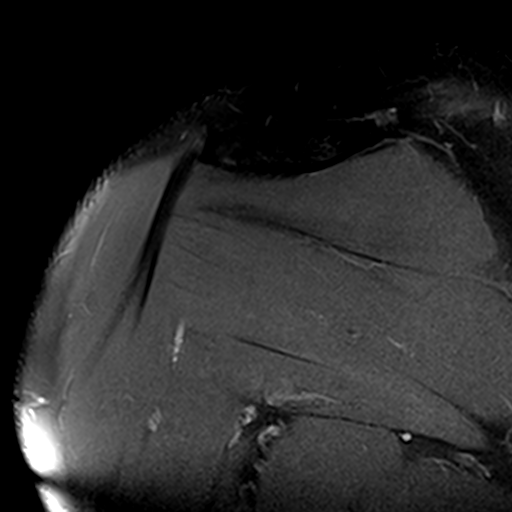

[19 of 40 positions shown; findings below may reference images not displayed]

FINDINGS: Rotator cuff: Mild tendinosis of the supraspinatus tendon and
infraspinatus tendon without a discrete tear. Teres minor tendon is
intact. Subscapularis tendon is intact.

Muscles: No atrophy or fatty replacement of nor abnormal signal
within, the muscles of the rotator cuff.

Biceps long head: Moderate tendinosis of the intra-articular portion
of the long head of the biceps tendon.

Acromioclavicular Joint: Severe arthropathy of the acromioclavicular
joint. Type I acromion. No subacromial/subdeltoid bursal fluid.

Glenohumeral Joint: No joint effusion. No chondral defect.

Labrum: Grossly intact, but evaluation is limited by lack of
intraarticular fluid.

Bones:  No acute osseous abnormality.  No aggressive osseous lesion.

Other: No fluid collection or hematoma.
IMPRESSION: 1. Mild tendinosis of the supraspinatus tendon and infraspinatus
tendon without a discrete tear.
2. Moderate tendinosis of the intra-articular portion of the long
head of the biceps tendon.

## 2022-12-01 ENCOUNTER — Encounter: Payer: Self-pay | Admitting: Radiology

## 2023-09-18 ENCOUNTER — Encounter: Payer: Self-pay | Admitting: *Deleted
